# Patient Record
Sex: Female | Born: 1965 | Race: White | Hispanic: No | Smoking: Never smoker
Health system: Southern US, Community
[De-identification: ages and names within clinical notes are randomized; demographics above are authoritative.]

## PROBLEM LIST (undated history)

## (undated) DIAGNOSIS — G20A1 Parkinson's disease without dyskinesia, without mention of fluctuations: Secondary | ICD-10-CM

## (undated) DIAGNOSIS — G259 Extrapyramidal and movement disorder, unspecified: Secondary | ICD-10-CM

## (undated) DIAGNOSIS — F419 Anxiety disorder, unspecified: Secondary | ICD-10-CM

## (undated) DIAGNOSIS — R251 Tremor, unspecified: Secondary | ICD-10-CM

## (undated) DIAGNOSIS — G243 Spasmodic torticollis: Secondary | ICD-10-CM

## (undated) DIAGNOSIS — F32A Depression, unspecified: Secondary | ICD-10-CM

## (undated) DIAGNOSIS — F329 Major depressive disorder, single episode, unspecified: Secondary | ICD-10-CM

## (undated) DIAGNOSIS — G629 Polyneuropathy, unspecified: Secondary | ICD-10-CM

## (undated) DIAGNOSIS — G35D Multiple sclerosis, unspecified: Secondary | ICD-10-CM

## (undated) DIAGNOSIS — F410 Panic disorder [episodic paroxysmal anxiety] without agoraphobia: Secondary | ICD-10-CM

## (undated) DIAGNOSIS — F4310 Post-traumatic stress disorder, unspecified: Secondary | ICD-10-CM

## (undated) DIAGNOSIS — I498 Other specified cardiac arrhythmias: Secondary | ICD-10-CM

## (undated) DIAGNOSIS — G35 Multiple sclerosis: Secondary | ICD-10-CM

## (undated) HISTORY — DX: Depression, unspecified: F32.A

## (undated) HISTORY — DX: Major depressive disorder, single episode, unspecified: F32.9

## (undated) HISTORY — DX: Post-traumatic stress disorder, unspecified: F43.10

## (undated) HISTORY — PX: FOOT FRACTURE SURGERY: SHX645

## (undated) HISTORY — PX: ADENOIDECTOMY: SUR15

## (undated) HISTORY — DX: Other specified cardiac arrhythmias: I49.8

## (undated) HISTORY — DX: Parkinson's disease without dyskinesia, without mention of fluctuations: G20.A1

## (undated) HISTORY — DX: Spasmodic torticollis: G24.3

## (undated) HISTORY — DX: Extrapyramidal and movement disorder, unspecified: G25.9

## (undated) HISTORY — DX: Multiple sclerosis: G35

## (undated) HISTORY — PX: OVARIAN CYST REMOVAL: SHX89

## (undated) HISTORY — PX: ABDOMINAL HYSTERECTOMY: SHX81

## (undated) HISTORY — DX: Tremor, unspecified: R25.1

## (undated) HISTORY — DX: Multiple sclerosis, unspecified: G35.D

## (undated) HISTORY — DX: Polyneuropathy, unspecified: G62.9

---

## 1997-07-05 ENCOUNTER — Encounter: Admission: RE | Admit: 1997-07-05 | Discharge: 1997-10-03 | Payer: Self-pay | Admitting: *Deleted

## 2000-03-17 ENCOUNTER — Other Ambulatory Visit: Admission: RE | Admit: 2000-03-17 | Discharge: 2000-03-17 | Payer: Self-pay | Admitting: Unknown Physician Specialty

## 2000-03-26 ENCOUNTER — Encounter: Payer: Self-pay | Admitting: Family Medicine

## 2000-03-26 ENCOUNTER — Ambulatory Visit (HOSPITAL_COMMUNITY): Admission: RE | Admit: 2000-03-26 | Discharge: 2000-03-26 | Payer: Self-pay | Admitting: Family Medicine

## 2000-04-10 ENCOUNTER — Other Ambulatory Visit: Admission: RE | Admit: 2000-04-10 | Discharge: 2000-04-10 | Payer: Self-pay | Admitting: *Deleted

## 2003-03-05 ENCOUNTER — Emergency Department (HOSPITAL_COMMUNITY): Admission: EM | Admit: 2003-03-05 | Discharge: 2003-03-05 | Payer: Self-pay | Admitting: *Deleted

## 2003-03-15 ENCOUNTER — Other Ambulatory Visit: Admission: RE | Admit: 2003-03-15 | Discharge: 2003-03-15 | Payer: Self-pay | Admitting: Obstetrics and Gynecology

## 2004-09-03 ENCOUNTER — Ambulatory Visit: Payer: Self-pay | Admitting: Cardiology

## 2004-09-06 ENCOUNTER — Ambulatory Visit: Payer: Self-pay | Admitting: Cardiology

## 2004-09-25 ENCOUNTER — Ambulatory Visit: Payer: Self-pay | Admitting: Cardiology

## 2004-09-28 ENCOUNTER — Ambulatory Visit: Payer: Self-pay | Admitting: Gastroenterology

## 2004-10-03 ENCOUNTER — Encounter (INDEPENDENT_AMBULATORY_CARE_PROVIDER_SITE_OTHER): Payer: Self-pay | Admitting: Specialist

## 2004-10-03 ENCOUNTER — Ambulatory Visit: Payer: Self-pay | Admitting: Gastroenterology

## 2005-02-01 ENCOUNTER — Ambulatory Visit: Payer: Self-pay | Admitting: Cardiology

## 2005-05-11 ENCOUNTER — Emergency Department (HOSPITAL_COMMUNITY): Admission: EM | Admit: 2005-05-11 | Discharge: 2005-05-11 | Payer: Self-pay | Admitting: Emergency Medicine

## 2005-05-23 ENCOUNTER — Other Ambulatory Visit: Admission: RE | Admit: 2005-05-23 | Discharge: 2005-05-23 | Payer: Self-pay | Admitting: Obstetrics and Gynecology

## 2005-08-21 ENCOUNTER — Encounter (INDEPENDENT_AMBULATORY_CARE_PROVIDER_SITE_OTHER): Payer: Self-pay | Admitting: Specialist

## 2005-08-21 ENCOUNTER — Ambulatory Visit (HOSPITAL_COMMUNITY): Admission: RE | Admit: 2005-08-21 | Discharge: 2005-08-22 | Payer: Self-pay | Admitting: Obstetrics and Gynecology

## 2005-11-05 ENCOUNTER — Ambulatory Visit: Payer: Self-pay | Admitting: Cardiology

## 2006-02-27 ENCOUNTER — Encounter: Admission: RE | Admit: 2006-02-27 | Discharge: 2006-02-27 | Payer: Self-pay | Admitting: Obstetrics and Gynecology

## 2008-06-06 ENCOUNTER — Encounter: Admission: RE | Admit: 2008-06-06 | Discharge: 2008-06-06 | Payer: Self-pay | Admitting: Family Medicine

## 2010-06-22 NOTE — Op Note (Signed)
[name], [name]             ACCOUNT NO.:  [id number]   MEDICAL RECORD NO.:  [id number]          PATIENT TYPE:  OIB   LOCATION:  9304                          FACILITY:  [facility]   PHYSICIAN:  Huel Cote, M.D. DATE OF BIRTH:  12/30/1965   DATE OF PROCEDURE:  08/21/2005  DATE OF DISCHARGE:                                 OPERATIVE REPORT   PREOPERATIVE DIAGNOSES:  1.  Menorrhagia.  2.  Pain.  3.  Fibroid uterus.   POSTOPERATIVE DIAGNOSES:  1.  Menorrhagia.  2.  Pain.  3.  Fibroid uterus.   PROCEDURE:  Laparoscopic-assisted vaginal hysterectomy and a right groin  excision of a mass by Dr. Kendrick Ranch.   SURGEON:  Dr. Huel Cote and Dr. Kendrick Ranch.   ASSISTANT:  Dr. Tracey Harries.   ANESTHESIA:  General.   SPECIMENS:  Uterus and cervix were sent and in addition probable lymph node  from Dr. Earlene Plater. They were sent to pathology intraoperatively.   ESTIMATED BLOOD LOSS:  200 mL.   URINE OUTPUT:  200 mL clear urine.   IV FLUIDS:  Approximately 2500 mL LR.   FINDINGS:  Uterus was 8 to 10 weeks in size and globular with a large  posterior fibroid noted.  It was retroverted.  Tubes and ovaries were  normal.  Posterior cul-de-sac did have some fullness noted at the time of  surgery.  However, with the rectal exam and a vaginal exam performed  simultaneously with laparoscopic probing, no definitive mass could be  identified.  It was felt it was redundant tissue.   PROCEDURE:  The patient was taken to the operating room where general  anesthesia was obtained without difficulty.  She was then prepped and draped  in the normal sterile fashion in the dorsal lithotomy position.  A speculum  was placed within the vagina, and the uterine manipulator from [employer] was  utilized.  The uterus was sounded to approximately 8 mm, and 8-mm tip was  placed on the manipulator.  This was then introduced into the uterus and the  balloon insufflated with approximately 7 mL of saline.  The  Foley catheter  was then placed in the bladder, and at this point, the attention was turned  to the patient's abdomen where an infraumbilical incision was made through a  preexisting scar with a scalpel after infiltration with 0.25% Marcaine.  The  Veress disposable needle was then placed within the incision and introduced  into the peritoneal cavity with aspiration and injection to confirm  intraperitoneal placement.  The pneumoperitoneum was then obtained with  approximately 2-1/2 liter CO2 gas.  A 5-mm trocar was then placed with the  camera in place under direct visualization and pelvis and abdomen inspected  with findings as previously stated.  Two additional 5-mm trocars were placed  in each lower quadrant after injection with 0.25% Marcaine under direct  visualization.  Attention was then turned to the patient's left where the  utero-ovarian ligaments which were slightly adherent to the patient's  sidewall were taken down sequentially with the [employer] cutting cautery unit.  The utero-ovarian round ligaments and  fallopian tubes were all transected  carefully and good hemostasis noted.  This was taken down to the level of  the bladder flap which was then underscored and also taken down to the  midline of the uterus.  Thus, with uterus freed on the left, attention was  turned to the right where similarly the fallopian tube, utero-ovarian  ligament and remaining broad ligament and round ligament were taken down  sequentially with cutting cautery unit and good hemostasis noted.  Bladder  flap was also taken down to the midline.  This dissection was then complete,  and all instruments and sponges were removed from the abdomen with the  trocars left in place and camera and gas turned off.  Attention was then  turned vaginally where the uterine manipulator was removed and the weighted  speculum placed within in the vagina.  Cervix was grasped with 2 Jacobson  tenaculums and circumferentially  incised with Bovie cautery.  This was then  further dissected with Mayo scissors.  The vaginal mucosa was dissected off  the underlying cervix.  The posterior cul-de-sac was then entered sharply  and Bonano speculum placed within it.  The uterosacral ligaments were then  taken down bilaterally with Zeppelin clamps, transection and suture ligature  of 0 Vicryl.  At this point, attention was turned to the anterior cul-de-sac  which was identified and entered sharply.  The retractor was then placed  within this, and with the uterus isolated from the bladder and the rectum,  it was then dissected further from the paracervical tissues up through the  uterine artery pedicles and broad ligament.  Each step was clamped with  Zeppelin clamps, transected and suture ligated with 0 Vicryl.  This was  carried up to where the previous dissection had occurred laparoscopically.  At this point with minimal tissue remaining, the uterus was grasped with  towel clips at the fundus and delivered from the vagina.  The remaining  tissue was clamped with 2 Heaney clamps, transected and 2 free ties placed  around it.  There was no active bleeding noted, and all pedicles appeared  hemostatic.  At this point, the Bonano speculum was replaced with the  shorter weighted speculum, and the posterior cuff was run in a running  locked fashion for hemostasis.  The uterosacral ligaments were then  reapproximated with a 0 Vicryl suture which was held in a hemostat and the  peritoneum closed with 2-0 Vicryl switched in a pursestring fashion.  The  peritoneum was then tied down.  The uterosacral ligaments tied down and all  sutures cut.  The vaginal cuff was then closed additionally with 2-0 in a  running locked fashion.  No active bleeding was noted at this point, and  therefore all instruments and sponges were removed from the vagina.  Counts  were correct.  Attention was returned to the patient's abdomen where gas  was reapplied and the camera turned on.  The abdomen and pelvis were carefully  inspected.  The vaginal cuff appeared very hemostatic.  There was a slight  bulging noted at the posterior aspect of the cuff which had been felt to be  the uterine manipulator prior to removal of the uterus.  However, this  persisted.  The patient did have a vaginal exam and a rectal exam performed  by Dr. Tracey Harries and had laparoscopic probing at the same time, and no  definitive mass could clearly be identified.  It was felt if anything there  could  be a small parasitic fibroid, but this could not be clearly seen, and  vaginally, the cul-de-sac had been carefully palpated and not felt.  It was  felt that likely this was just redundant vaginal tissue, and therefore no  further action was taken.  Instruments and trocars were then removed under  direct visualization with no active bleeding noted and the pneumoperitoneum  reduced.  The abdominal incisions were then closed with 3-0 Vicryl in a  subcuticular stitch, and the patient was left in the care of Dr. Kendrick Ranch  who is going to complete her node dissection of the groin.      Huel Cote, M.D.  Electronically Signed     KR/MEDQ  D:  08/21/2005  T:  08/21/2005  Job:  [id number]

## 2010-06-22 NOTE — Discharge Summary (Signed)
[name], [name]             ACCOUNT NO.:  [id number]   MEDICAL RECORD NO.:  [id number]          PATIENT TYPE:  OIB   LOCATION:  9304                          FACILITY:  [facility]   PHYSICIAN:  Timothy E. Earlene Plater, M.D. DATE OF BIRTH:  1965-09-06   DATE OF ADMISSION:  08/21/2005  DATE OF DISCHARGE:                                 DISCHARGE SUMMARY   PREOP DIAGNOSIS:  Chronic lymphadenopathy right groin.   PROCEDURE:  Excision of lymph node right groin.   SURGEON:  Dr. Onalee Hua.   ANESTHESIA:  General.   HISTORY:  [name] had been seen and evaluated in the office at  the outset of planning for this gynecologic surgery and we have agreed to  proceed with excision on lymph node at this time.  Dr. Georgianne Fick had  proceeded with induction of anesthesia and a hysterectomy.  Please see her  reports.   I entered the room at the completion of the hysterectomy and the patient was  stable.  Permit was signed.  She had been identified.  The right groin was  examined.  It had been prepped and draped.  A lymph node was palpated.  The  area was marked and _injection________ with epinephrine was used.  A small  incision made over the lymph node.  It was dissected from the deep  subcutaneous space.  Bleeding points were cauterized, the lymph node removed  and submitted in formalin for routine exam.  The wound was closed in layers  with Monocryl and Steri-strips.  She tolerated it well, final count was  correct.  She was awakened and taken to the recovery room in good condition.      Timothy E. Earlene Plater, M.D.  Electronically Signed     TED/MEDQ  D:  08/21/2005  T:  08/21/2005  Job:  [id number]   cc:   Huel Cote, M.D.  Fax: [phone]

## 2010-06-22 NOTE — Assessment & Plan Note (Signed)
[facility]                            [facility] OFFICE NOTE   NAME:[name], [name]                    MRN:          [id number]  DATE:11/05/2005                            DOB:          1965/09/25    PRIMARY CARE PHYSICIAN:  Alfredia Client, M.D.   REASON FOR VISIT:  Follow up palpitations.   HISTORY OF PRESENT ILLNESS:  [name] was in the office last in December  and seen by Arnette Felts at that time.  Her history is outlined in previous  notes and includes recurrent chest pain that is felt to be non-cardiac based  on reassuring cardiac CT angiography and 2D echocardiography.  She does have  documented reflux disease with stricture and is status post esophageal  dilatation by Dr. Russella Dar in the past.  There is a question of mitral valve  prolapse, although this was not noted on her most recent echocardiogram.  She has a reported history of panic attacks and takes Clonazepam.  She has  also had a nice response to Toprol XL from the perspective of palpitations  and anxiety.  She reports that she has had some episodes, although not  frequent, of palpitations and a numb feeling in her left arm.  This is not  precipitated by exertion.  I talked about the overall situation today and  reassured her about her prior cardiac testing.  She has been taking Toprol  XL up to 50 mg a day and I suggested she might consider increasing this to  50 mg twice a day as needed.  We also talked about basic risk modification  strategies.  I suggested that she have a fasting lipid profile obtained by  her primary care physician just for a baseline.   ALLERGIES:  PENICILLIN.   PRESENT MEDICATIONS:  Prevacid 30 mg p.o. b.i.d., Toprol XL 50 mg p.o.  daily, Clonazepam 1 mg p.o. q.i.d.   REVIEW OF SYMPTOMS:  As described in the history of present illness.   PHYSICAL EXAMINATION:  VITAL SIGNS:  Blood pressure 110/62, heart rate 82, weight 127 pounds.  GENERAL:  The  patient is comfortable in no acute distress.  NECK:  No elevated jugular venous pressure.  HEART:  Regular rate and rhythm without rub, murmur, or gallop.  EXTREMITIES:  No obvious pitting edema.   IMPRESSION AND RECOMMENDATIONS:  1. History of palpitations with normal ejection fraction and reassuring CT      angiogram. I suspect that there is an element of anxiety and there has      been no particular arrhythmias documented by prior monitoring.  I have      recommended that she continue her Toprol XL, consider an increase from      50 mg to 100 mg in divided dose if needed.  I would not anticipate any      additional cardiac testing at this particular time except to say that a      fasting lipid profile would be reasonable at baseline to assure that      she does not have dyslipidemia  that would need to be managed, otherwise.  We can plan to see her back      as needed.  2. Otherwise, continue regular follow up with Dr. Morrie Sheldon.       Jonelle Sidle, MD     SGM/MedQ  DD:  11/05/2005  DT:  11/06/2005  Job #:  [id number]   cc:   Alfredia Client, M.D.

## 2010-06-22 NOTE — H&P (Signed)
[name], [name]             ACCOUNT NO.:  [id number]   MEDICAL RECORD NO.:  [id number]          PATIENT TYPE:  AMB   LOCATION:  SDC                           FACILITY:  [facility]   PHYSICIAN:  Huel Cote, M.D. DATE OF BIRTH:  1965/07/27   DATE OF ADMISSION:  08/21/2005  DATE OF DISCHARGE:                                HISTORY & PHYSICAL   This is a preoperative history and physical.  Surgery is to take place at  [facility] on August 21, 2005 at 8:30 a.m.   The patient is a 45 year old G1, P1 who is coming in for a scheduled  laparoscopic-assisted vaginal hysterectomy and possible abdominal  hysterectomy, given an ongoing problem with menorrhagia and flooding.  The  patient has tried alternative therapies in the past, including oral  contraceptive pills; however, does not typically tolerate these secondary to  nausea.  She also approximately one month ago had some issues with  increasing pelvic pain across her abdomen, which radiated into her rectal  area, which has persisted despite her waiting approximately two months for  resolution.  On ultrasound, the patient was confirmed to have multiple  fibroids and also on pelvic exam, her uterus is indeed retroverted and  nodular and sits directly over the rectum.  She requests definitive surgical  therapy for her problems with menorrhagia and pelvic pain related to her  fibroids.  After a careful discussion of all options, wishes to proceed with  surgery.   PAST MEDICAL HISTORY:  Significant for anxiety and palpitations.   PAST GYN HISTORY:  History of HSV.  No abnormal Pap smears.   PAST OB HISTORY:  She has had one vaginal delivery of a 6 pound, 12 ounce  infant.   PAST SURGICAL HISTORY:  In 2002, she had laparoscopy, ovarian cystectomy for  a ruptured ovarian cyst.   CURRENT MEDICATIONS:  Include Toprol, Valtrex, and clonazepam with  occasional Vicodin for pain.   ALLERGIES:  Include PENICILLIN and a DYE that she  received during her  cardiac cath.   During her workup for this pelvic pain, it was discovered that the patient  had a persistent right groin node which had been noted two years ago.  The  patient had been encouraged to get worked up.  The patient had one episode  where this became quite swollen and ruptured with exudate noted.  She was  placed on an antibiotic by her primary medical doctor at that time; however,  her lymphadenopathy has really persisted for several years.  She saw Dr. Kendrick Ranch separately for this issue, and at the same time, he will excise this  pelvic node at the time of surgery.  She has been worked up for STDs, which  were negative, and states she has not been sexually active in greater than a  year.  The patient was counseled as to the risks and benefits of surgery,  including bleeding, infection, possible damage to bowel and bladder.  She  understands these risks but desires to proceed with surgery, as stated.  The  uterus is quite retroverted, and her cervix  is anterior secondary to this  displacement.  The patient knows that this might make a vaginal approach  slightly more difficult and that should not proceed well, that we would  likely need to make an abdominal transverse incision to complete the  surgery.  She also realizes that if there are any complications, including  bowel or bladder injury, that she would need an abdominal procedure to  correct these problems.  We also  discussed possible removal of her right ovary, given that she had been  having some pain on that side and a possible recent ovarian cyst rupture;  however, should they appear normal, we will leave her ovaries in place.  Dr.  Kendrick Ranch will proceed with excision of her groin node after we complete her  hysterectomy surgery.      Huel Cote, M.D.  Electronically Signed     KR/MEDQ  D:  08/20/2005  T:  08/20/2005  Job:  [id number]

## 2010-07-08 ENCOUNTER — Observation Stay (HOSPITAL_COMMUNITY)
Admission: EM | Admit: 2010-07-08 | Discharge: 2010-07-10 | Disposition: A | Discharge status: Home / Self Care | Payer: Self-pay | Attending: Internal Medicine | Admitting: Internal Medicine

## 2010-07-08 ENCOUNTER — Emergency Department (HOSPITAL_COMMUNITY): Payer: Self-pay

## 2010-07-08 DIAGNOSIS — F410 Panic disorder [episodic paroxysmal anxiety] without agoraphobia: Secondary | ICD-10-CM | POA: Insufficient documentation

## 2010-07-08 DIAGNOSIS — F17200 Nicotine dependence, unspecified, uncomplicated: Secondary | ICD-10-CM | POA: Insufficient documentation

## 2010-07-08 DIAGNOSIS — Z79899 Other long term (current) drug therapy: Secondary | ICD-10-CM | POA: Insufficient documentation

## 2010-07-08 DIAGNOSIS — R0789 Other chest pain: Principal | ICD-10-CM | POA: Insufficient documentation

## 2010-07-08 LAB — COMPREHENSIVE METABOLIC PANEL WITH GFR
ALT: 16 U/L (ref 0–35)
AST: 19 U/L (ref 0–37)
Alkaline Phosphatase: 72 U/L (ref 39–117)
CO2: 25 meq/L (ref 19–32)
Calcium: 9.8 mg/dL (ref 8.4–10.5)
GFR calc Af Amer: >60 mL/min (ref >60)
GFR calc non Af Amer: >60 mL/min (ref >60)
Glucose, Bld: 115 mg/dL — ABNORMAL HIGH (ref 70–99)
Potassium: 3.5 meq/L (ref 3.5–5.1)
Sodium: 139 meq/L (ref 135–145)

## 2010-07-08 LAB — COMPREHENSIVE METABOLIC PANEL
Albumin: 4.2 g/dL (ref 3.5–5.2)
BUN: 9 mg/dL (ref 6–23)
Chloride: 103 mEq/L (ref 96–112)
Creatinine, Ser: 0.64 mg/dL (ref 0.4–1.2)
Total Bilirubin: 0.5 mg/dL (ref 0.3–1.2)
Total Protein: 7.4 g/dL (ref 6.0–8.3)

## 2010-07-08 LAB — CBC
HCT: 41.3 % (ref 36.0–46.0)
Hemoglobin: 14.3 g/dL (ref 12.0–15.0)
MCH: 32.6 pg (ref 26.0–34.0)
MCHC: 34.6 g/dL (ref 30.0–36.0)
MCV: 94.3 fL (ref 78.0–100.0)
Platelets: 200 10*3/uL (ref 150–400)
RBC: 4.38 MIL/uL (ref 3.87–5.11)
RDW: 12.1 % (ref 11.5–15.5)
WBC: 9.1 10*3/uL (ref 4.0–10.5)

## 2010-07-08 LAB — CK TOTAL AND CKMB (NOT AT ARMC)
CK, MB: 1.4 ng/mL (ref 0.3–4.0)
Relative Index: INVALID (ref 0.0–2.5)
Relative Index: INVALID (ref 0.0–2.5)
Total CK: 80 U/L (ref 7–177)
Total CK: 69 U/L (ref 7–177)

## 2010-07-08 LAB — TROPONIN I
Troponin I: <0.3 ng/mL (ref <0.30)
Troponin I: <0.3 ng/mL (ref <0.30)

## 2010-07-10 LAB — TSH: TSH: 2.449 u[IU]/mL (ref 0.350–4.500)

## 2010-08-01 NOTE — Discharge Summary (Signed)
  [name], [name]             ACCOUNT NO.:  [id number]  MEDICAL RECORD NO.:  [id number]  LOCATION:  1505                         FACILITY:  [facility]  PHYSICIAN:  Kathlen Mody, MD       DATE OF BIRTH:  [birth year]  DATE OF ADMISSION:  07/08/2010 DATE OF DISCHARGE:  07/10/2010                              DISCHARGE SUMMARY   PRIMARY CARE PHYSICIAN:  Dr. Creta Levin, and a physician at [facility].  DISCHARGE DIAGNOSES: 1. Chest pain, most likely secondary to an anxiety attack. 2. History of panic attacks. 3. Anxiety attack.  DISCHARGE MEDICATIONS: 1. Acetaminophen 650 mg 4 hours as needed. 2. Loratadine 1 tablet daily. 3. Protonix 40 mg daily. 4. Aspirin 81 mg daily. 5. Klonopin 0.5 mg half a tablet to one tablet take 3 times a day as     needed for 5 days.  CONSULTATIONS:  None.  PROCEDURES:  None.  PERTINENT LABORATORY DATA:  On admission, the patient had a CBC which was within normal limits.  CK-MB and troponin which were within normal limits.  The next set of cardiac enzymes x3 were within normal limits. TSH was within normal limits.  Comprehensive metabolic panel was significant for a glucose of 115.  RADIOLOGICAL DATA:  The patient had a 2-view chest x-ray showed no acute cardiopulmonary abnormality.  The patient had an echocardiogram done which showed good ejection fraction, left ventricular ejection fraction, no regional wall abnormalities, the left ventricular diastolic function parameters normal, and right ventricular systolic function was also normal, no pericardial effusion.  BRIEF HOSPITAL COURSE:  This is a 45 year old female with history of panic attacks, was admitted for chest pressure.  She was admitted to rule out acute coronary syndrome.  Her enzymes were negative.  Her EKG was normal sinus rhythm.  Her echocardiogram was within normal limits. Her TSH was normal.  Her tele monitoring was normal and her chest pain was attributed most  likely secondary to her anxiety attack and she resumed her Klonopin and she was recommended to follow up with her PCP in about 2-3 days and she was given appropriate followups with her PCP and also further addressing with her medications. On the day of discharge, the patient's vitals were within normal limits. Cardiac exam was within normal limits.  She was recommended to follow up with her PCP in about 2-3 days.          ______________________________ Kathlen Mody, MD     VA/MEDQ  D:  07/30/2010  T:  07/30/2010  Job:  [id number]  Electronically Signed by Kathlen Mody MD on 08/01/2010 07:35:14 AM

## 2010-08-20 NOTE — H&P (Signed)
NAME:  [name], [name] NO.:  [id number]  MEDICAL RECORD NO.:  [id number]           [name] TYPE:  E  LOCATION:  WLED                         FACILITY:  [facility]  PHYSICIAN:  [name], [name]:  [name]  DATE OF ADMISSION:  07/08/2010 DATE OF DISCHARGE:                             HISTORY & PHYSICAL   PRIMARY CARE PHYSICIAN:  Warrick Parisian, MD, with [facility].  [name] was evaluated by Dr. Diona Browner in 2008 but has never seen him post her hospital visit.  CHIEF COMPLAINT:  Chest pain.  HISTORY OF PRESENT ILLNESS:  [name] is a 45 year old female with no real significant medical history who presents with a complaint of chest pain.  [name] states that she has been having an underlying heaviness in her chest that has been occurring for several months.  She states, however, in Ashley last 2 weeks, she has had intermittent sharp pains in her chest going up in to Ashley jaw and radiating down Ashley left upper extremity.  She states that these pains last approximately 20 to 30 minutes at a time and resolve on their own.  She is unable to identify any palliative or provocative features.  She states that Ashley pain is usually about a 6/10 when it occurs.  She does not identify Ashley pain specifically with activity and states that it occurs both with activity and at rest.  She has had no fever or chills.  She has had no nausea, vomiting or diarrhea.  She has had no weight loss or weight gain.  She states that usually accompanying Ashley episodes are shortness of breath and diaphoresis.  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  PAST MEDICAL HISTORY:  Significant for panic attacks.  FAMILY HISTORY:  Significant for cancer in her mother; [name] does not know what form of cancer, and father who has an arrhythmia.  There is no history of coronary artery disease or myocardial  infarctions in Ashley family.  SOCIAL HISTORY:  [name] smokes about one pack per week for about 4 years.  She denies any alcohol or drug use.  As noted, she works as a Lawyer.  Currently [name] is on XXX status as she does not want her family to know that she is here in Ashley hospital.  HOME MEDICATIONS:  Include Klonopin.  At this time, I do not have frequency and dosage.  ALLERGIES: 1. PENICILLIN. 2. SULFA. 3. IV DYE.  RADIOLOGIC AND LABORATORY DATA:  Review of former records:  [name] has a cardiac CT which was found to be normal.  Studies in Ashley emergency room show Ashley following:  Hemogram shows a white blood cell count of 9.1, hemoglobin of 14.3, hematocrit of 41.3, platelet count of 200.  Sodium 139, potassium 3.5, chloride 103, bicarb 25, BUN 9, creatinine 0.64.  CK is 80 and troponin is less than 0.30. Two-view chest x-ray shows no acute cardiopulmonary abnormality.  PHYSICAL EXAMINATION:  GENERAL:  [name] is a slim, well-nourished, well-developed female. VITAL SIGNS:  Temperature is 98.5, heart  rate 70, blood pressure 111/71, respiratory rate 14, O2 saturations are 99% on room air.  HEENT:  She is normocephalic, atraumatic.  Pupils are equally round, reactive to light and accommodation.  Extraocular movements are intact.  Oropharynx is moist.  No exudates, erythema or lesions are noted. NECK:  Trachea is midline.  No masses, no thyromegaly, no JVD, no carotid bruit. RESPIRATORY:  Normal respiratory effort, equal excursion bilaterally. No wheezing or rhonchi noted. CARDIOVASCULAR:  Normal S1 and S2.  No murmurs, rubs or gallops noted. PMI is nondisplaced.  No heaves or thrills on palpation. ABDOMEN:  Obese, soft, nontender, nondistended.  No masses, no hepatosplenomegaly is noted. EXTREMITIES:  No clubbing, cyanosis or edema. PSYCHIATRIC:  Alert and oriented x3, good insight and cognition, good recent and remote recall.  ASSESSMENT AND PLAN:  This is a  [name] who is being admitted on an observation basis for chest pain, rule out myocardial infarction.  [name] is low risk for a myocardial infarction.  We will cycle [name]'s enzymes, and if they are normal, and [name] is ambulating without any episodes of pain, I think this [name] can safely be further evaluated on outpatient basis.  I will not make [name] n.p.o. as I am not anticipating a stress test on this [name].  We will do risk stratification and check her lipids to see where she falls under Ashley risk category.  I will also check a TSH on her.  [name] will be resumed on Klonopin.  I do believe that this is likely more related to anxiety, given Ashley negative enzymes when [name] has been having chest pain lasting more than 30 minutes at a time.  Thus far, there are no other acute issues identified on this [name]. She will receive deep venous thrombosis prophylaxis with Lovenox and further testing will be done on [name]'s clinical course and initial response to therapy.     Ashley Harm, MD     MAM/MEDQ  D:  07/08/2010  T:  07/08/2010  Job:  [id number]  cc:   Warrick Parisian, MD Fax: [phone]  Electronically Signed by Marthann Schiller MD on 08/20/2010 05:55:09 PM

## 2011-03-06 ENCOUNTER — Emergency Department (HOSPITAL_COMMUNITY): Payer: Self-pay

## 2011-03-06 ENCOUNTER — Encounter (HOSPITAL_COMMUNITY): Payer: Self-pay | Admitting: *Deleted

## 2011-03-06 ENCOUNTER — Emergency Department (HOSPITAL_COMMUNITY)
Admission: EM | Admit: 2011-03-06 | Discharge: 2011-03-06 | Disposition: A | Discharge status: Home / Self Care | Payer: Self-pay | Attending: Emergency Medicine | Admitting: Emergency Medicine

## 2011-03-06 ENCOUNTER — Other Ambulatory Visit: Payer: Self-pay

## 2011-03-06 DIAGNOSIS — R0602 Shortness of breath: Secondary | ICD-10-CM | POA: Insufficient documentation

## 2011-03-06 DIAGNOSIS — R42 Dizziness and giddiness: Secondary | ICD-10-CM | POA: Insufficient documentation

## 2011-03-06 DIAGNOSIS — R0609 Other forms of dyspnea: Secondary | ICD-10-CM | POA: Insufficient documentation

## 2011-03-06 DIAGNOSIS — R110 Nausea: Secondary | ICD-10-CM | POA: Insufficient documentation

## 2011-03-06 DIAGNOSIS — R0989 Other specified symptoms and signs involving the circulatory and respiratory systems: Secondary | ICD-10-CM | POA: Insufficient documentation

## 2011-03-06 DIAGNOSIS — R51 Headache: Secondary | ICD-10-CM | POA: Insufficient documentation

## 2011-03-06 DIAGNOSIS — F411 Generalized anxiety disorder: Secondary | ICD-10-CM | POA: Insufficient documentation

## 2011-03-06 DIAGNOSIS — R61 Generalized hyperhidrosis: Secondary | ICD-10-CM | POA: Insufficient documentation

## 2011-03-06 DIAGNOSIS — R071 Chest pain on breathing: Secondary | ICD-10-CM | POA: Insufficient documentation

## 2011-03-06 DIAGNOSIS — E876 Hypokalemia: Secondary | ICD-10-CM | POA: Insufficient documentation

## 2011-03-06 DIAGNOSIS — R0789 Other chest pain: Secondary | ICD-10-CM

## 2011-03-06 DIAGNOSIS — I10 Essential (primary) hypertension: Secondary | ICD-10-CM | POA: Insufficient documentation

## 2011-03-06 HISTORY — DX: Anxiety disorder, unspecified: F41.9

## 2011-03-06 LAB — BASIC METABOLIC PANEL
BUN: 8 mg/dL (ref 6–23)
CO2: 25 mEq/L (ref 19–32)
Chloride: 105 mEq/L (ref 96–112)
Creatinine, Ser: 0.71 mg/dL (ref 0.50–1.10)
GFR calc Af Amer: >90 mL/min (ref >90)
Potassium: 3.2 mEq/L — ABNORMAL LOW (ref 3.5–5.1)

## 2011-03-06 LAB — DIFFERENTIAL
Basophils Absolute: 0 10*3/uL (ref 0.0–0.1)
Basophils Relative: 0 % (ref 0–1)
Lymphocytes Relative: 17 % (ref 12–46)
Monocytes Absolute: 0.5 10*3/uL (ref 0.1–1.0)
Monocytes Relative: 5 % (ref 3–12)
Neutro Abs: 7.3 10*3/uL (ref 1.7–7.7)
Neutrophils Relative %: 77 % (ref 43–77)

## 2011-03-06 LAB — CBC
HCT: 40.2 % (ref 36.0–46.0)
Hemoglobin: 13.8 g/dL (ref 12.0–15.0)
WBC: 9.4 10*3/uL (ref 4.0–10.5)

## 2011-03-06 MED ORDER — KETOROLAC TROMETHAMINE 30 MG/ML IJ SOLN
30.0000 mg | Freq: Once | INTRAMUSCULAR | Status: AC
  Administered 2011-03-06: 30 mg via INTRAVENOUS
  Filled 2011-03-06: qty 1

## 2011-03-06 MED ORDER — ASPIRIN 81 MG PO CHEW
324.0000 mg | CHEWABLE_TABLET | Freq: Once | ORAL | Status: AC
  Administered 2011-03-06: 324 mg via ORAL
  Filled 2011-03-06: qty 4

## 2011-03-06 MED ORDER — POTASSIUM CHLORIDE CRYS ER 20 MEQ PO TBCR: 20.0000 meq | EXTENDED_RELEASE_TABLET | Freq: Two times a day (BID) | ORAL | Status: DC

## 2011-03-06 MED ORDER — METOCLOPRAMIDE HCL 10 MG PO TABS: 10.0000 mg | ORAL_TABLET | Freq: Four times a day (QID) | ORAL | Status: AC | PRN

## 2011-03-06 MED ORDER — POTASSIUM CHLORIDE CRYS ER 20 MEQ PO TBCR
40.0000 meq | EXTENDED_RELEASE_TABLET | Freq: Once | ORAL | Status: AC
  Administered 2011-03-06: 40 meq via ORAL
  Filled 2011-03-06: qty 2

## 2011-03-06 MED ORDER — DIPHENHYDRAMINE HCL 50 MG/ML IJ SOLN
25.0000 mg | Freq: Once | INTRAMUSCULAR | Status: AC
  Administered 2011-03-06: 25 mg via INTRAVENOUS
  Filled 2011-03-06: qty 1

## 2011-03-06 MED ORDER — METOCLOPRAMIDE HCL 5 MG/ML IJ SOLN
10.0000 mg | Freq: Once | INTRAMUSCULAR | Status: AC
  Administered 2011-03-06: 10 mg via INTRAVENOUS
  Filled 2011-03-06: qty 2

## 2011-03-06 NOTE — ED Provider Notes (Signed)
History     CSN: [id number]  Arrival date & time 03/06/11  1428   First MD Initiated Contact with Patient 03/06/11 1533      Chief Complaint  Patient presents with  . Chest Pain  . Headache    (Consider location/radiation/quality/duration/timing/severity/associated sxs/prior treatment) Patient is a 46 y.o. female presenting with chest pain and headaches. The history is provided by the patient.  Chest Pain    Headache   She started having chest pain last night. Pain is a sharp, stabbing pain in the left anterior chest which is momentary and is followed by a steady tight feeling. She rates it at 6/10 currently but has been as severe as 8/10. There is intermittent associated dyspnea and intermittent associated nausea. She will get mild diaphoresis but this only occurs during the times when she has the sharp, stabbing pains. Pain is not affected by body position, breathing, movement, exertion. As far she's been out of tell, nothing makes it better nothing makes it worse. She did take 2 doses of aspirin 81 mg with no improvement and she took an extra dose of her Klonopin with no improvement. She was hospitalized for a similar pain before and to nothing serious was found. She also developed a headache last night which is a throbbing frontal headache which she rates at 6/10. Nothing makes the headache better nothing makes it worse. This is unusual for her because she states that she usually does not get headaches. While driving over to the hospital, she started feeling anxious and felt like she might pass out. Her last several days, she has also been checking her blood pressure at home and has found it to be as low as 70/40. She states that the monitor that she use has been checked against other monitors and has been accurate. She has felt dizzy and lightheaded at home with these episodes and has been trying to eat a lot of salt but has been unable to get her diastolic pressure over 40. Her cardiac  risk factors are only for a positive family history of coronary artery disease with her father having heart problems her started in his 58s. She is a nonsmoker and denies history of hypertension, diabetes, and hypercholesterolemia.  Past Medical History  Diagnosis Date  . Anxiety     Past Surgical History  Procedure Date  . Abdominal hysterectomy   . Adenoidectomy     No family history on file.  History  Substance Use Topics  . Smoking status: Former Games developer  . Smokeless tobacco: Not on file  . Alcohol Use: No    OB History    Grav Para Term Preterm Abortions TAB SAB Ect Mult Living                  Review of Systems  Cardiovascular: Positive for chest pain.  Neurological: Positive for headaches.  All other systems reviewed and are negative.    Allergies  Penicillins and Sulfa antibiotics  Home Medications   Current Outpatient Rx  Name Route Sig Dispense Refill  . CLONAZEPAM 0.5 MG PO TABS Oral Take 0.5 mg by mouth 2 (two) times daily as needed.      BP 162/89  Pulse 100  Resp 18  Wt 140 lb (63.504 kg)  SpO2 100%  Physical Exam  Nursing note and vitals reviewed.  46 year old female who is resting comfortably and in no acute distress. Vital signs are significant for mild hypertension with blood pressure 162/89. Oxygen saturation is  100% which is normal. Head is normocephalic and atraumatic. PERRLA, EOMI. Fundi show no hemorrhage, exudate, or papilledema. She has no photophobia. Oropharynx is clear. Neck is supple without adenopathy, JVD, or bruit. Back is nontender. Lungs are clear without rales, wheezes, or rhonchi. Heart has regular rate and rhythm without murmur. There is moderate left-sided chest wall tenderness. Abdomen is soft, flat, nontender without masses or hepatomegaly. Jimmy's have no cyanosis or edema, full range of motion is present. Skin is warm and moist without rash. Neurologic: Mental status is normal except for moderate anxiety. Cranial nerves are  intact, there no focal motor or sensory deficits.  ED Course  Procedures (including critical care time)  Results for orders placed during the hospital encounter of 03/06/11  CBC      Component Value Range   WBC 9.4  4.0 - 10.5 (K/uL)   RBC 4.29  3.87 - 5.11 (MIL/uL)   Hemoglobin 13.8  12.0 - 15.0 (g/dL)   HCT 46.9  62.9 - 52.8 (%)   MCV 93.7  78.0 - 100.0 (fL)   MCH 32.2  26.0 - 34.0 (pg)   MCHC 34.3  30.0 - 36.0 (g/dL)   RDW 41.3  24.4 - 01.0 (%)   Platelets 212  150 - 400 (K/uL)  DIFFERENTIAL      Component Value Range   Neutrophils Relative 77  43 - 77 (%)   Neutro Abs 7.3  1.7 - 7.7 (K/uL)   Lymphocytes Relative 17  12 - 46 (%)   Lymphs Abs 1.6  0.7 - 4.0 (K/uL)   Monocytes Relative 5  3 - 12 (%)   Monocytes Absolute 0.5  0.1 - 1.0 (K/uL)   Eosinophils Relative 1  0 - 5 (%)   Eosinophils Absolute 0.1  0.0 - 0.7 (K/uL)   Basophils Relative 0  0 - 1 (%)   Basophils Absolute 0.0  0.0 - 0.1 (K/uL)  BASIC METABOLIC PANEL      Component Value Range   Sodium 142  135 - 145 (mEq/L)   Potassium 3.2 (*) 3.5 - 5.1 (mEq/L)   Chloride 105  96 - 112 (mEq/L)   CO2 25  19 - 32 (mEq/L)   Glucose, Bld 105 (*) 70 - 99 (mg/dL)   BUN 8  6 - 23 (mg/dL)   Creatinine, Ser 2.72  0.50 - 1.10 (mg/dL)   Calcium 9.6  8.4 - 53.6 (mg/dL)   GFR calc non Af Amer >90  >90 (mL/min)   GFR calc Af Amer >90  >90 (mL/min)   Dg Chest 2 View  03/06/2011  *RADIOLOGY REPORT*  Clinical Data: Chest pain and shortness of breath.  CHEST - 2 VIEW  Comparison: 07/08/2010  Findings: Heart size and vascularity are normal and the lungs are clear.  No osseous abnormality.  IMPRESSION: Normal chest.  Original Report Authenticated By: Gwynn Burly, M.D.    She was given an IV fluid bolus and IV Toradol, IV Reglan, and IV Benadryl. Following this, she feels much better. Blood pressure is consistently stayed high normal to high. There is no evidence of any hypotension. Orthostatic vital signs are normal. She does have  mild hypokalemia and will be given a potassium supplement.  1. Chest wall pain   2. Headache   3. Hypokalemia       MDM  Old records are reviewed and she was hospitalized last June for chest pain which was felt to be due to anxiety. Current pain has a low likelihood  of being cardiac with it being constant and the sharp stabbing pains. Her allegedly low blood pressure at home is difficult to reconcile with high blood pressure readings here and I suspect that her monitor at home is giving her faulty readings. However, she will have with a static vital signs checked here. She'll get a headache cocktail with Toradol, Reglan, and Benadryl and to see its effect on both the chest pain and her headache.        Dione Booze, MD 03/07/11 1538

## 2011-03-06 NOTE — ED Notes (Signed)
Pt states "I've been feeling weak for several days, have been checking blood pressure @ home & it's been being low"

## 2011-03-06 NOTE — Discharge Instructions (Signed)
Chest Pain (Nonspecific) It is often hard to give a specific diagnosis for the cause of chest pain. There is always a chance that your pain could be related to something serious, such as a heart attack or a blood clot in the lungs. You need to follow up with your caregiver for further evaluation. CAUSES   Heartburn.   Pneumonia or bronchitis.   Anxiety and stress.   Inflammation around your heart (pericarditis) or lung (pleuritis or pleurisy).   A blood clot in the lung.   A collapsed lung (pneumothorax). It can develop suddenly on its own (spontaneous pneumothorax) or from injury (trauma) to the chest.  The chest wall is composed of bones, muscles, and cartilage. Any of these can be the source of the pain.  The bones can be bruised by injury.   The muscles or cartilage can be strained by coughing or overwork.   The cartilage can be affected by inflammation and become sore (costochondritis).  DIAGNOSIS  Lab tests or other studies, such as X-rays, an EKG, stress testing, or cardiac imaging, may be needed to find the cause of your pain.  TREATMENT   Treatment depends on what may be causing your chest pain. Treatment may include:   Acid blockers for heartburn.   Anti-inflammatory medicine.   Pain medicine for inflammatory conditions.   Antibiotics if an infection is present.   You may be advised to change lifestyle habits. This includes stopping smoking and avoiding caffeine and chocolate.   You may be advised to keep your head raised (elevated) when sleeping. This reduces the chance of acid going backward from your stomach into your esophagus.   Most of the time, nonspecific chest pain will improve within 2 to 3 days with rest and mild pain medicine.  HOME CARE INSTRUCTIONS   If antibiotics were prescribed, take the full amount even if you start to feel better.   For the next few days, avoid physical activities that bring on chest pain. Continue physical activities as  directed.   Do not smoke cigarettes or drink alcohol until your symptoms are gone.   Only take over-the-counter or prescription medicine for pain, discomfort, or fever as directed by your caregiver.   Follow your caregiver's suggestions for further testing if your chest pain does not go away.   Keep any follow-up appointments you made. If you do not go to an appointment, you could develop lasting (chronic) problems with pain. If there is any problem keeping an appointment, you must call to reschedule.  SEEK MEDICAL CARE IF:   You think you are having problems from the medicine you are taking. Read your medicine instructions carefully.   Your chest pain does not go away, even after treatment.   You develop a rash with blisters on your chest.  SEEK IMMEDIATE MEDICAL CARE IF:   You have increased chest pain or pain that spreads to your arm, neck, jaw, back, or belly (abdomen).   You develop shortness of breath, an increasing cough, or you are coughing up blood.   You have severe back or abdominal pain, feel sick to your stomach (nauseous) or throw up (vomit).   You develop severe weakness, fainting, or chills.   You have an oral temperature above 102 F (38.9 C), not controlled by medicine.  THIS IS AN EMERGENCY. Do not wait to see if the pain will go away. Get medical help at once. Call your local emergency services (911 in [place] Do not drive yourself to   the hospital. MAKE SURE YOU:   Understand these instructions.   Will watch your condition.   Will get help right away if you are not doing well or get worse.  Document Released: 10/31/2004 Document Revised: 10/03/2010 Document Reviewed: 08/27/2007 [facility] Patient Information 2012 [place], [place].  Headache Headaches are caused by many different problems. Most commonly, headache is caused by muscle tension from an injury, fatigue, or emotional upset. Excessive muscle contractions in the scalp and neck result in a headache that  often feels like a tight band around the head. Tension headaches often have areas of tenderness over the scalp and the back of the neck. These headaches may last for hours, days, or longer, and some may contribute to migraines in those who have migraine problems. Migraines usually cause a throbbing headache, which is made worse by activity. Sometimes only one side of the head hurts. Nausea, vomiting, eye pain, and avoidance of food are common with migraines. Visual symptoms such as light sensitivity, blind spots, or flashing lights may also occur. Loud noises may worsen migraine headaches. Many factors may cause migraine headaches:  Emotional stress, lack of sleep, and menstrual periods.   Alcohol and some drugs (such as birth control pills).   Diet factors (fasting, caffeine, food preservatives, chocolate).   Environmental factors (weather changes, bright lights, odors, smoke).  Other causes of headaches include minor injuries to the head. Arthritis in the neck; problems with the jaw, eyes, ears, or nose are also causes of headaches. Allergies, drugs, alcohol, and exposure to smoke can also cause moderate headaches. Rebound headaches can occur if someone uses pain medications for a long period of time and then stops. Less commonly, blood vessel problems in the neck and brain (including stroke) can cause various types of headache. Treatment of headaches includes medicines for pain and relaxation. Ice packs or heat applied to the back of the head and neck help some people. Massaging the shoulders, neck and scalp are often very useful. Relaxation techniques and stretching can help prevent these headaches. Avoid alcohol and cigarette smoking as these tend to make headaches worse. Please see your caregiver if your headache is not better in 2 days.  SEEK IMMEDIATE MEDICAL CARE IF:   You develop a high fever, chills, or repeated vomiting.   You faint or have difficulty with vision.   You develop unusual  numbness or weakness of your arms or legs.   Relief of pain is inadequate with medication, or you develop severe pain.   You develop confusion, or neck stiffness.   You have a worsening of a headache or do not obtain relief.  Document Released: 01/21/2005 Document Revised: 10/03/2010 Document Reviewed: 07/17/2006 [employer] Patient Information 2012 [employer], LLCMetoclopramide tablets What is this medicine? METOCLOPRAMIDE (met oh kloe PRA mide) is used to treat the symptoms of gastroesophageal reflux disease (GERD) like heartburn. It is also used to treat people with slow emptying of the stomach and intestinal tract. This medicine may be used for other purposes; ask your health care provider or pharmacist if you have questions. What should I tell my health care provider before I take this medicine? They need to know if you have any of these conditions: -breast cancer -depression -diabetes -heart failure -high blood pressure -kidney disease -liver disease -Parkinson's disease or a movement disorder -pheochromocytoma -seizures -stomach obstruction, bleeding, or perforation -an unusual or allergic reaction to metoclopramide, procainamide, sulfites, other medicines, foods, dyes, or preservatives -pregnant or trying to get pregnant -breast-feeding How should I  use this medicine? Take this medicine by mouth with a glass of water. Follow the directions on the prescription label. Take this medicine on an empty stomach, about 30 minutes before eating. Take your doses at regular intervals. Do not take your medicine more often than directed. Do not stop taking except on the advice of your doctor or health care professional. A special MedGuide will be given to you by the pharmacist with each prescription and refill. Be sure to read this information carefully each time. Talk to your pediatrician regarding the use of this medicine in children. Special care may be needed. Overdosage: If you think you  have taken too much of this medicine contact a poison control center or emergency room at once. NOTE: This medicine is only for you. Do not share this medicine with others. What if I miss a dose? If you miss a dose, take it as soon as you can. If it is almost time for your next dose, take only that dose. Do not take double or extra doses. What may interact with this medicine? -acetaminophen -cyclosporine -digoxin -medicines for blood pressure -medicines for diabetes, including insulin -medicines for hay fever and other allergies -medicines for depression, especially an Monoamine Oxidase Inhibitor (MAOI) -medicines for Parkinson's disease, like levodopa -medicines for sleep or for pain -tetracycline This list may not describe all possible interactions. Give your health care provider a list of all the medicines, herbs, non-prescription drugs, or dietary supplements you use. Also tell them if you smoke, drink alcohol, or use illegal drugs. Some items may interact with your medicine. What should I watch for while using this medicine? It may take a few weeks for your stomach condition to start to get better. However, do not take this medicine for longer than 12 weeks. The longer you take this medicine, and the more you take it, the greater your chances are of developing serious side effects. If you are an elderly patient, a female patient, or you have diabetes, you may be at an increased risk for side effects from this medicine. Contact your doctor immediately if you start having movements you cannot control such as lip smacking, rapid movements of the tongue, involuntary or uncontrollable movements of the eyes, head, arms and legs, or muscle twitches and spasms. Patients and their families should watch out for worsening depression or thoughts of suicide. Also watch out for any sudden or severe changes in feelings such as feeling anxious, agitated, panicky, irritable, hostile, aggressive, impulsive,  severely restless, overly excited and hyperactive, or not being able to sleep. If this happens, especially at the beginning of treatment or after a change in dose, call your doctor. Do not treat yourself for high fever. Ask your doctor or health care professional for advice. You may get drowsy or dizzy. Do not drive, use machinery, or do anything that needs mental alertness until you know how this drug affects you. Do not stand or sit up quickly, especially if you are an older patient. This reduces the risk of dizzy or fainting spells. Alcohol can make you more drowsy and dizzy. Avoid alcoholic drinks. What side effects may I notice from receiving this medicine? Side effects that you should report to your doctor or health care professional as soon as possible: -allergic reactions like skin rash, itching or hives, swelling of the face, lips, or tongue -abnormal production of milk in females -breast enlargement in both males and females -change in the way you walk -difficulty moving, speaking or swallowing -  drooling, lip smacking, or rapid movements of the tongue -excessive sweating -fever -involuntary or uncontrollable movements of the eyes, head, arms and legs -irregular heartbeat or palpitations -muscle twitches and spasms -unusually weak or tired Side effects that usually do not require medical attention (report to your doctor or health care professional if they continue or are bothersome): -change in sex drive or performance -depressed mood -diarrhea -difficulty sleeping -headache -menstrual changes -restless or nervous This list may not describe all possible side effects. Call your doctor for medical advice about side effects. You may report side effects to FDA at 1-800-FDA-1088. Where should I keep my medicine? Keep out of the reach of children. Store at room temperature between 20 and 25 degrees C (68 and 77 degrees F). Protect from light. Keep container tightly closed. Throw away  any unused medicine after the expiration date. NOTE: This sheet is a summary. It may not cover all possible information. If you have questions about this medicine, talk to your doctor, pharmacist, or health care provider.  2012, [employer]. (09/16/2007 4:30:05 PM).

## 2011-03-06 NOTE — ED Notes (Signed)
Urine collected and sent down to lab for keeping. No order for testing at this time. RN notified 

## 2011-08-06 ENCOUNTER — Other Ambulatory Visit (HOSPITAL_COMMUNITY): Payer: Self-pay | Admitting: Nurse Practitioner

## 2011-08-06 DIAGNOSIS — IMO0002 Reserved for concepts with insufficient information to code with codable children: Secondary | ICD-10-CM

## 2011-08-21 ENCOUNTER — Ambulatory Visit (HOSPITAL_COMMUNITY)
Admission: RE | Admit: 2011-08-21 | Discharge: 2011-08-21 | Disposition: A | Discharge status: Home / Self Care | Payer: PRIVATE HEALTH INSURANCE | Source: Ambulatory Visit | Attending: Nurse Practitioner | Admitting: Nurse Practitioner

## 2011-08-21 ENCOUNTER — Other Ambulatory Visit (HOSPITAL_COMMUNITY): Payer: Self-pay | Admitting: Nurse Practitioner

## 2011-08-21 DIAGNOSIS — IMO0002 Reserved for concepts with insufficient information to code with codable children: Secondary | ICD-10-CM

## 2011-08-21 DIAGNOSIS — N63 Unspecified lump in unspecified breast: Secondary | ICD-10-CM | POA: Insufficient documentation

## 2012-10-02 ENCOUNTER — Other Ambulatory Visit: Payer: Self-pay | Admitting: *Deleted

## 2012-10-02 NOTE — Telephone Encounter (Signed)
Wrong chart

## 2014-04-26 ENCOUNTER — Other Ambulatory Visit (HOSPITAL_COMMUNITY): Payer: Self-pay | Admitting: *Deleted

## 2014-04-26 DIAGNOSIS — Z1231 Encounter for screening mammogram for malignant neoplasm of breast: Secondary | ICD-10-CM

## 2014-04-28 ENCOUNTER — Ambulatory Visit (HOSPITAL_COMMUNITY)
Admission: RE | Admit: 2014-04-28 | Discharge: 2014-04-28 | Disposition: A | Discharge status: Home / Self Care | Payer: PRIVATE HEALTH INSURANCE | Source: Ambulatory Visit | Attending: *Deleted | Admitting: *Deleted

## 2014-04-28 DIAGNOSIS — Z1231 Encounter for screening mammogram for malignant neoplasm of breast: Secondary | ICD-10-CM | POA: Insufficient documentation

## 2015-02-09 ENCOUNTER — Emergency Department (HOSPITAL_COMMUNITY)
Admission: EM | Admit: 2015-02-09 | Discharge: 2015-02-09 | Disposition: A | Discharge status: Home / Self Care | Payer: PRIVATE HEALTH INSURANCE | Attending: Emergency Medicine | Admitting: Emergency Medicine

## 2015-02-09 ENCOUNTER — Encounter (HOSPITAL_COMMUNITY): Payer: Self-pay | Admitting: *Deleted

## 2015-02-09 ENCOUNTER — Emergency Department (HOSPITAL_COMMUNITY): Payer: Self-pay

## 2015-02-09 ENCOUNTER — Emergency Department (HOSPITAL_COMMUNITY): Payer: PRIVATE HEALTH INSURANCE

## 2015-02-09 DIAGNOSIS — Z880 Allergy status to penicillin: Secondary | ICD-10-CM | POA: Insufficient documentation

## 2015-02-09 DIAGNOSIS — N644 Mastodynia: Secondary | ICD-10-CM | POA: Insufficient documentation

## 2015-02-09 DIAGNOSIS — R29898 Other symptoms and signs involving the musculoskeletal system: Secondary | ICD-10-CM

## 2015-02-09 DIAGNOSIS — M25522 Pain in left elbow: Secondary | ICD-10-CM | POA: Insufficient documentation

## 2015-02-09 DIAGNOSIS — F410 Panic disorder [episodic paroxysmal anxiety] without agoraphobia: Secondary | ICD-10-CM | POA: Insufficient documentation

## 2015-02-09 DIAGNOSIS — Z7982 Long term (current) use of aspirin: Secondary | ICD-10-CM | POA: Insufficient documentation

## 2015-02-09 DIAGNOSIS — R0789 Other chest pain: Secondary | ICD-10-CM | POA: Insufficient documentation

## 2015-02-09 DIAGNOSIS — M79602 Pain in left arm: Secondary | ICD-10-CM | POA: Insufficient documentation

## 2015-02-09 DIAGNOSIS — Z79899 Other long term (current) drug therapy: Secondary | ICD-10-CM | POA: Insufficient documentation

## 2015-02-09 DIAGNOSIS — G20 Parkinson's disease: Secondary | ICD-10-CM | POA: Insufficient documentation

## 2015-02-09 DIAGNOSIS — G249 Dystonia, unspecified: Secondary | ICD-10-CM | POA: Insufficient documentation

## 2015-02-09 DIAGNOSIS — M25512 Pain in left shoulder: Secondary | ICD-10-CM | POA: Insufficient documentation

## 2015-02-09 DIAGNOSIS — Z87891 Personal history of nicotine dependence: Secondary | ICD-10-CM | POA: Insufficient documentation

## 2015-02-09 HISTORY — DX: Panic disorder (episodic paroxysmal anxiety): F41.0

## 2015-02-09 LAB — BASIC METABOLIC PANEL
ANION GAP: 9 (ref 5–15)
BUN: 7 mg/dL (ref 6–20)
CHLORIDE: 109 mmol/L (ref 101–111)
CO2: 23 mmol/L (ref 22–32)
Calcium: 9.1 mg/dL (ref 8.9–10.3)
Creatinine, Ser: 0.73 mg/dL (ref 0.44–1.00)
GFR calc Af Amer: >60 mL/min (ref >60)
Glucose, Bld: 98 mg/dL (ref 65–99)
POTASSIUM: 3.9 mmol/L (ref 3.5–5.1)
SODIUM: 141 mmol/L (ref 135–145)

## 2015-02-09 LAB — CBC
HCT: 42.4 % (ref 36.0–46.0)
Hemoglobin: 14.5 g/dL (ref 12.0–15.0)
MCH: 32.9 pg (ref 26.0–34.0)
MCHC: 34.2 g/dL (ref 30.0–36.0)
MCV: 96.1 fL (ref 78.0–100.0)
PLATELETS: 201 10*3/uL (ref 150–400)
RBC: 4.41 MIL/uL (ref 3.87–5.11)
RDW: 12.4 % (ref 11.5–15.5)
WBC: 10.8 10*3/uL — ABNORMAL HIGH (ref 4.0–10.5)

## 2015-02-09 LAB — ETHANOL

## 2015-02-09 LAB — RAPID URINE DRUG SCREEN, HOSP PERFORMED
Amphetamines: NOT DETECTED
BARBITURATES: NOT DETECTED
Benzodiazepines: NOT DETECTED
COCAINE: NOT DETECTED
Opiates: NOT DETECTED
Tetrahydrocannabinol: POSITIVE — AB

## 2015-02-09 LAB — I-STAT TROPONIN, ED: TROPONIN I, POC: 0 ng/mL (ref 0.00–0.08)

## 2015-02-09 MED ORDER — GADOBENATE DIMEGLUMINE 529 MG/ML IV SOLN
13.0000 mL | Freq: Once | INTRAVENOUS | Status: AC | PRN
  Administered 2015-02-09: 13 mL via INTRAVENOUS

## 2015-02-09 MED ORDER — LORAZEPAM 2 MG/ML IJ SOLN
2.0000 mg | Freq: Once | INTRAMUSCULAR | Status: AC
  Administered 2015-02-09: 2 mg via INTRAVENOUS
  Filled 2015-02-09: qty 1

## 2015-02-09 MED ORDER — BACLOFEN 10 MG PO TABS: 10.0000 mg | ORAL_TABLET | Freq: Every evening | ORAL | Status: DC | PRN

## 2015-02-09 MED ORDER — LORAZEPAM 2 MG/ML IJ SOLN
1.0000 mg | Freq: Once | INTRAMUSCULAR | Status: AC
Start: 2015-02-09 — End: 2015-02-09
  Administered 2015-02-09: 1 mg via INTRAVENOUS
  Filled 2015-02-09: qty 1

## 2015-02-09 MED ORDER — LORAZEPAM 2 MG/ML IJ SOLN: 1.0000 mg | Freq: Once | INTRAMUSCULAR | Status: DC | PRN

## 2015-02-09 MED ORDER — MORPHINE SULFATE (PF) 4 MG/ML IV SOLN
4.0000 mg | Freq: Once | INTRAVENOUS | Status: AC
  Administered 2015-02-09: 4 mg via INTRAVENOUS
  Filled 2015-02-09: qty 1

## 2015-02-09 NOTE — ED Provider Notes (Signed)
CSN: [id number]     Arrival date & time 02/09/15  1327 History   First MD Initiated Contact with Patient 02/09/15 1329     Chief Complaint  Patient presents with  . Chest Pain  . Numbness    Left arm     (Consider location/radiation/quality/duration/timing/severity/associated sxs/prior Treatment) HPI  50 year old female presents with a chief complaint of left arm pain. She has had numbness and tingling as well as a weakness sensation on and off for about one month. Seems to be worse over the last few days. She states that 4 days ago she developed chest tightness that has come and gone and associated with this has had the left arm weakness and numbness. She went to her doctor today and they told her that her arm was cold and so she was centrically to the ER. Patient states she has not noticed her arm being cold and currently feels like her arm is normal in temperature. She feels like her hand is particular hard to move. The last 2 weeks she's been having a lot of left breast pain and is concerned about breast cancer. This is different than the chest tightness she was experiencing. The chest tightness is currently gone. There is no associated shortness of breath. She denies any lower extremity weakness or numbness. No abdominal pain or vomiting. She is having aching and pain in her left elbow and left shoulder. She has a history of tremors and has been told she has early parkinson's.  Past Medical History  Diagnosis Date  . Anxiety   . Panic attacks    Past Surgical History  Procedure Laterality Date  . Abdominal hysterectomy    . Adenoidectomy     No family history on file. Social History  Substance Use Topics  . Smoking status: Former Games developer  . Smokeless tobacco: Never Used  . Alcohol Use: No   OB History    No data available     Review of Systems  Constitutional: Negative for fever.  Respiratory: Positive for chest tightness. Negative for shortness of breath.   Cardiovascular:  Positive for chest pain.  Gastrointestinal: Negative for vomiting.  Musculoskeletal: Positive for arthralgias. Negative for neck pain.  Neurological: Positive for weakness and numbness. Negative for headaches.  All other systems reviewed and are negative.     Allergies  Penicillins; Contrast media; and Sulfa antibiotics  Home Medications   Prior to Admission medications   Medication Sig Start Date End Date Taking? Authorizing Provider  aspirin EC 81 MG tablet Take 81 mg by mouth daily.    Historical Provider, MD  clonazePAM (KLONOPIN) 0.5 MG tablet Take 0.5 mg by mouth 2 (two) times daily as needed. anxiety    Historical Provider, MD  potassium chloride SA (K-DUR,KLOR-CON) 20 MEQ tablet Take 1 tablet (20 mEq total) by mouth 2 (two) times daily. 03/06/11 03/05/12  Dione Booze, MD   BP 126/79 mmHg  Pulse 77  Temp(Src) 98.3 F (36.8 C) (Oral)  Resp 13  Ht 5\' 6"  (1.676 m)  Wt 130 lb (58.968 kg)  BMI 20.99 kg/m2  SpO2 100% Physical Exam  Constitutional: She is oriented to person, place, and time. She appears well-developed and well-nourished.  HENT:  Head: Normocephalic and atraumatic.  Right Ear: External ear normal.  Left Ear: External ear normal.  Nose: Nose normal.  Eyes: Right eye exhibits no discharge. Left eye exhibits no discharge.  Cardiovascular: Normal rate, regular rhythm, normal heart sounds and intact distal pulses.  No murmur heard. Pulses:      Radial pulses are 2+ on the right side, and 2+ on the left side.       Dorsalis pedis pulses are 2+ on the right side, and 2+ on the left side.  Pulmonary/Chest: Effort normal and breath sounds normal. She has no wheezes. She has no rales.  Mild tenderness in multiple locations of left breast but no erythema, swelling or induration. No obvious masses  Abdominal: Soft. There is no tenderness.  Musculoskeletal:       Left shoulder: She exhibits no tenderness.       Left elbow: No tenderness found.  Upper extremities are  equal in color and both have brisk cap refill. No coolness or abnormal pulses. L hand appears to be held in a claw like pose.  Neurological: She is alert and oriented to person, place, and time.  Equal strength in upper extremities. LUE with increased tone. Able to bend joints but takes mild, constant pressure. She can move LUE freely. LLE appears weaker than right. She can lift it off the bed but it takes great effort and is then easily pushed down with little resistance. RLE normal.  Skin: Skin is warm and dry.  Nursing note and vitals reviewed.   ED Course  Procedures (including critical care time) Labs Review Labs Reviewed  CBC - Abnormal; Notable for the following:    WBC 10.8 (*)    All other components within normal limits  URINE RAPID DRUG SCREEN, HOSP PERFORMED - Abnormal; Notable for the following:    Tetrahydrocannabinol POSITIVE (*)    All other components within normal limits  BASIC METABOLIC PANEL  ETHANOL  I-STAT TROPOININ, ED    Imaging Review Dg Chest 2 View  02/09/2015  CLINICAL DATA:  Chest pain EXAM: CHEST  2 VIEW COMPARISON:  03/06/2011 FINDINGS: Normal heart size and mediastinal contours. No acute infiltrate or edema. No effusion or pneumothorax. No acute osseous findings. IMPRESSION: Negative chest. Electronically Signed   By: Marnee SpringJonathon  Watts M.D.   On: 02/09/2015 14:45   I have personally reviewed and evaluated these images and lab results as part of my medical decision-making.   EKG Interpretation   Date/Time:  Thursday February 09 2015 13:29:19 EST Ventricular Rate:  76 PR Interval:    QRS Duration: 80 QT Interval:  411 QTC Calculation: 462 R Axis:   19 Text Interpretation:  Atrial flutter with predominant 4:1 AV block  Anteroseptal infarct, age indeterminate nonspecific T waves unchanged  since 2013 Confirmed by Shauna Bodkins  MD, Keia Rask ([id number]) on 02/09/2015 2:11:11 PM      MDM   Final diagnoses:  Dystonia  Breast pain, left    Patient's chest pain  is quite atypical. Her tightness is gone at this time and has not been present today. She is having chronic left breast pain that she has had since getting a mammogram that was unremarkable in March of last year. On my breast exam I do not feel he obvious lumps or signs of an infection such as abscess. Her neuro exam is concerning for spasticity/dystonia. Discussed with neuro, Dr. Hosie PoissonSumner, who has evaluated patient and feels that she needs more test to evaluate whether or not his Parkinson's or some other neuromuscular disorder. Recommend starting her on baclofen at night. He feels that due to her insurance status she will did get most benefited by [facility]. Her PCP will help her with this. MRI pending, if negative,  will discharge with this plan. Care to Dr. Bebe Shaggy with MRI pending.    Pricilla Loveless, MD 02/09/15 [phone]

## 2015-02-09 NOTE — ED Notes (Signed)
Pt arrives from her PCP via [facility] c/o central chest pain tightness with numbness and tingling on the left arm. Pt states that she feel an aching sensation in her left elbow.

## 2015-02-09 NOTE — Consult Note (Signed)
NEURO HOSPITALIST CONSULT NOTE   Requestig physician: Dr. Criss Alvine   Reason for Consult: Dystonic posturing of left  HPI:                                                                                                                                          [name] is an 50 y.o. female presenting to hospital with 2 months of left arm tingling, left neck sidecomfort, increased tremor in both head and neck now with dystonic posturing of left hand. She has a family history of PD but is unclear what medications they were on and how they were diagnosed. Her husband states that her tremor and dystonia are very much like her fathers symptoms. She has seen Dr. Vickey Huger in the past for tremor and was prescribed a medication but is unclear the medication she was on.  She has no insurance and cannot afford the medications.   Past Medical History  Diagnosis Date  . Anxiety   . Panic attacks     Past Surgical History  Procedure Laterality Date  . Abdominal hysterectomy    . Adenoidectomy      Family History  Problem Relation Age of Onset  . Parkinson's disease Father     Social History:  reports that she has quit smoking. She has never used smokeless tobacco. She reports that she uses illicit drugs (Cocaine and Marijuana). She reports that she does not drink alcohol.  Allergies  Allergen Reactions  . Penicillins Anaphylaxis  . Contrast Media [Iodinated Diagnostic Agents] Other (See Comments)    unknown  . Sulfa Antibiotics Rash    MEDICATIONS:                                                                                                                     Current Facility-Administered Medications  Medication Dose Route Frequency Provider Last Rate Last Dose  . LORazepam (ATIVAN) injection 2 mg  2 mg Intravenous Once Pricilla Loveless, MD       Current Outpatient Prescriptions  Medication Sig Dispense Refill  . acetaminophen (TYLENOL) 325 MG tablet Take  650 mg by mouth every 6 (six) hours as needed for mild pain.    . potassium chloride SA (K-DUR,KLOR-CON) 20 MEQ tablet Take 1 tablet (20 mEq  total) by mouth 2 (two) times daily. 20 tablet 0      ROS:                                                                                                                                       History obtained from the patient  General ROS: negative for - chills, fatigue, fever, night sweats, weight gain or weight loss Psychological ROS: negative for - behavioral disorder, hallucinations, memory difficulties, mood swings or suicidal ideation Ophthalmic ROS: negative for - blurry vision, double vision, eye pain or loss of vision ENT ROS: negative for - epistaxis, nasal discharge, oral lesions, sore throat, tinnitus or vertigo Allergy and Immunology ROS: negative for - hives or itchy/watery eyes Hematological and Lymphatic ROS: negative for - bleeding problems, bruising or swollen lymph nodes Endocrine ROS: negative for - galactorrhea, hair pattern changes, polydipsia/polyuria or temperature intolerance Respiratory ROS: negative for - cough, hemoptysis, shortness of breath or wheezing Cardiovascular ROS: negative for - chest pain, dyspnea on exertion, edema or irregular heartbeat Gastrointestinal ROS: negative for - abdominal pain, diarrhea, hematemesis, nausea/vomiting or stool incontinence Genito-Urinary ROS: negative for - dysuria, hematuria, incontinence or urinary frequency/urgency Musculoskeletal ROS: negative for - joint swelling or muscular weakness Neurological ROS: as noted in HPI Dermatological ROS: negative for rash and skin lesion changes   Blood pressure 143/89, pulse 77, temperature 98.3 F (36.8 C), temperature source Oral, resp. rate 13, height 5\' 6"  (1.676 m), weight 58.968 kg (130 lb), SpO2 99 %.   Neurologic Examination:                                                                                                      HEENT-   Normocephalic, no lesions, without obvious abnormality.  Normal external eye and conjunctiva.  Normal TM's bilaterally.  Normal auditory canals and external ears. Normal external nose, mucus membranes and septum.  Normal pharynx. Cardiovascular- S1, S2 normal, pulses palpable throughout   Lungs- chest clear, no wheezing, rales, normal symmetric air entry Abdomen- normal findings: bowel sounds normal Extremities- no edema Lymph-no adenopathy palpable Musculoskeletal-no joint tenderness, deformity or swelling Skin-warm and dry, no hyperpigmentation, vitiligo, or suspicious lesions  Neurological Examination Mental Status: Alert, oriented, thought content appropriate.  Speech fluent without evidence of aphasia.  Able to follow 3 step commands without difficulty. Cranial Nerves: II: Discs flat bilaterally; Visual fields grossly normal, pupils equal, round, reactive to light and accommodation III,IV, VI: ptosis not present, extra-ocular motions intact bilaterally  V,VII: smile symmetric, facial light touch sensation normal bilaterally VIII: hearing normal bilaterally IX,X: uvula rises symmetrically XI: bilateral shoulder shrug XII: midline tongue extension Motor: Right : Upper extremity   5/5    Left:     Upper extremity   4/5 with flexed dystonic posturing  Lower extremity   5/5     Lower extremity   4/5 -slow movements of LUE, increased tone, no true bradykinesia --neck Torticollis, spasmodic head tremor Sensory: Pinprick and light touch intact throughout, bilaterally Deep Tendon Reflexes: 2+ and symmetric throughout Plantars: Right: downgoing   Left: downgoing Cerebellar: Bilateral intention tremor, difficult to complete on LUE Gait: slow with initiation.       Lab Results: Basic Metabolic Panel:  Recent Labs Lab 02/09/15 1519  NA 141  K 3.9  CL 109  CO2 23  GLUCOSE 98  BUN 7  CREATININE 0.73  CALCIUM 9.1    Liver Function Tests: No results for input(s): AST, ALT,  ALKPHOS, BILITOT, PROT, ALBUMIN in the last 168 hours. No results for input(s): LIPASE, AMYLASE in the last 168 hours. No results for input(s): AMMONIA in the last 168 hours.  CBC:  Recent Labs Lab 02/09/15 1400  WBC 10.8*  HGB 14.5  HCT 42.4  MCV 96.1  PLT 201    Cardiac Enzymes: No results for input(s): CKTOTAL, CKMB, CKMBINDEX, TROPONINI in the last 168 hours.  Lipid Panel: No results for input(s): CHOL, TRIG, HDL, CHOLHDL, VLDL, LDLCALC in the last 168 hours.  CBG: No results for input(s): GLUCAP in the last 168 hours.  Microbiology: No results found for this or any previous visit.  Coagulation Studies: No results for input(s): LABPROT, INR in the last 72 hours.  Imaging: Dg Chest 2 View  02/09/2015  CLINICAL DATA:  Chest pain EXAM: CHEST  2 VIEW COMPARISON:  03/06/2011 FINDINGS: Normal heart size and mediastinal contours. No acute infiltrate or edema. No effusion or pneumothorax. No acute osseous findings. IMPRESSION: Negative chest. Electronically Signed   By: Marnee SpringJonathon  Watts M.D.   On: 02/09/2015 14:45       Assessment and plan per attending neurologist  Felicie Mornavid Smith [name] [facility] [phone]  02/09/2015, 4:33 PM   Assessment/Plan: 50 YO female with left arm weakness and paresthesias which has worsened over the past 1-2 months. 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 Unclear etiology of her symptoms. Will check MRI brain and C spine for possible structural cause. She notes a strong family history of similar symptoms raising the possibility of a genetic component. Of note, she has been told her symptoms are potentially related to PD. At this time her presentation is not fully consistent with PD and I would hold on initiating treatment for PD.   She would likely benefit from Botox injections but this may not be possible due to her lack of insurance. Can try baclofen for potential symptomatic relief. If MRI  imaging unremarkable then I would suggest [facility] where she may have more potential treatment and evaluation options in an academic center.    Elspeth Choeter Davonta Stroot, DO [facility] [phone]  If 7pm- 7am, please page neurology on call as listed in AMION.

## 2015-02-09 NOTE — ED Provider Notes (Signed)
Discussed MRI results with patient She is awake/alert, well appearing, no distress Advised outpatient followup   Ashley Rhineonald Dawanda Mapel, MD 02/09/15 2138

## 2015-02-09 NOTE — Discharge Instructions (Signed)
Call your primary doctor for a close follow-up appointment. You'll need your primary care provider to refer you to [facility]. It is important to get into this neurologist decision can for further testing of why you are having these arm symptoms.

## 2015-02-13 ENCOUNTER — Other Ambulatory Visit: Payer: Self-pay | Admitting: Family Medicine

## 2015-02-13 DIAGNOSIS — N6320 Unspecified lump in the left breast, unspecified quadrant: Secondary | ICD-10-CM

## 2015-02-14 ENCOUNTER — Telehealth: Payer: Self-pay | Admitting: Cardiovascular Disease

## 2015-02-14 NOTE — Telephone Encounter (Signed)
Received records from [facility] for appointment on 03/28/15 with Dr Allyson SabalBerry.  [facility] for Dr Hazle CocaBerry's schedule on 03/28/15.  lp

## 2015-03-09 ENCOUNTER — Other Ambulatory Visit: Payer: PRIVATE HEALTH INSURANCE

## 2015-03-17 ENCOUNTER — Ambulatory Visit: Payer: PRIVATE HEALTH INSURANCE | Admitting: Cardiovascular Disease

## 2015-03-28 ENCOUNTER — Ambulatory Visit: Payer: PRIVATE HEALTH INSURANCE | Admitting: Cardiovascular Disease

## 2015-04-17 ENCOUNTER — Ambulatory Visit: Payer: Self-pay | Admitting: Family Medicine

## 2015-04-17 ENCOUNTER — Encounter: Payer: Self-pay | Admitting: Family Medicine

## 2015-04-17 ENCOUNTER — Telehealth: Payer: Self-pay | Admitting: Family Medicine

## 2015-04-17 VITALS — BP 122/83 | HR 94 | Temp 98.8°F | Ht 66.0 in | Wt 134.8 lb

## 2015-04-17 DIAGNOSIS — R300 Dysuria: Secondary | ICD-10-CM

## 2015-04-17 DIAGNOSIS — N3000 Acute cystitis without hematuria: Secondary | ICD-10-CM

## 2015-04-17 LAB — MICROSCOPIC EXAMINATION
Epithelial Cells (non renal): >10 /hpf — ABNORMAL HIGH (ref 0–10)
RBC MICROSCOPIC, UA: NONE SEEN /HPF (ref 0–?)

## 2015-04-17 LAB — URINALYSIS, COMPLETE
Bilirubin, UA: NEGATIVE
GLUCOSE, UA: NEGATIVE
KETONES UA: NEGATIVE
LEUKOCYTES UA: NEGATIVE
NITRITE UA: NEGATIVE
Protein, UA: NEGATIVE
SPEC GRAV UA: 1.015 (ref 1.005–1.030)
Urobilinogen, Ur: 0.2 mg/dL (ref 0.2–1.0)
pH, UA: 6 (ref 5.0–7.5)

## 2015-04-17 MED ORDER — ONDANSETRON 4 MG PO TBDP: 4.0000 mg | ORAL_TABLET | Freq: Three times a day (TID) | ORAL | Status: DC | PRN

## 2015-04-17 MED ORDER — CIPROFLOXACIN HCL 500 MG PO TABS: 500.0000 mg | ORAL_TABLET | Freq: Two times a day (BID) | ORAL | Status: DC

## 2015-04-17 NOTE — Telephone Encounter (Signed)
Take with food and send for zofran 4 odt tid prn

## 2015-04-17 NOTE — Progress Notes (Signed)
BP 122/83 mmHg  Pulse 94  Temp(Src) 98.8 F (37.1 C) (Oral)  Ht 5\' 6"  (1.676 m)  Wt 134 lb 12.8 oz (61.145 kg)  BMI 21.77 kg/m2   Subjective:    Patient ID: Ashley GibsonSabrenna A Pressman, female    DOB: 09/11/1965, 50 y.o.   MRN: [id number]  HPI: [name] is a 50 y.o. female presenting on 04/17/2015 for Chills; Hematuria; Bloated; and Back Pain   HPI Dysuria and abdominal pain Patient has had dysuria and urinary frequency and hematuria has been going on for the past 5 days. She is also had some burning and irritation at the introitus. She denies any fevers or chills but has had some achiness and malaise. She denies any nausea vomiting diarrhea or constipation. She has had UTIs and pyelonephritis previously.  Relevant past medical, surgical, family and social history reviewed and updated as indicated. Interim medical history since our last visit reviewed. Allergies and medications reviewed and updated.  Review of Systems  Constitutional: Negative for fever and chills.  HENT: Negative for congestion, ear discharge and ear pain.   Eyes: Negative for redness and visual disturbance.  Respiratory: Negative for chest tightness and shortness of breath.   Cardiovascular: Negative for chest pain and leg swelling.  Gastrointestinal: Positive for abdominal pain. Negative for nausea, vomiting and diarrhea.  Genitourinary: Positive for dysuria, urgency, frequency and hematuria. Negative for flank pain, decreased urine volume, vaginal bleeding, vaginal discharge, difficulty urinating, vaginal pain and pelvic pain.  Musculoskeletal: Negative for back pain and gait problem.  Skin: Negative for rash.  Neurological: Negative for light-headedness and headaches.  Psychiatric/Behavioral: Negative for behavioral problems and agitation.  All other systems reviewed and are negative.   Per HPI unless specifically indicated above     Medication List       This list is accurate as of: 04/17/15 12:05  PM.  Always use your most recent med list.               ciprofloxacin 500 MG tablet  Commonly known as:  CIPRO  Take 1 tablet (500 mg total) by mouth 2 (two) times daily.     clonazePAM 0.5 MG tablet  Commonly known as:  KLONOPIN  Take 0.5 mg by mouth daily.     potassium chloride SA 20 MEQ tablet  Commonly known as:  K-DUR,KLOR-CON  Take 1 tablet (20 mEq total) by mouth 2 (two) times daily.           Objective:    BP 122/83 mmHg  Pulse 94  Temp(Src) 98.8 F (37.1 C) (Oral)  Ht 5\' 6"  (1.676 m)  Wt 134 lb 12.8 oz (61.145 kg)  BMI 21.77 kg/m2  Wt Readings from Last 3 Encounters:  04/17/15 134 lb 12.8 oz (61.145 kg)  02/09/15 130 lb (58.968 kg)  03/06/11 140 lb (63.504 kg)    Physical Exam  Constitutional: She is oriented to person, place, and time. She appears well-developed and well-nourished. No distress.  Eyes: Conjunctivae and EOM are normal. Pupils are equal, round, and reactive to light.  Cardiovascular: Normal rate, regular rhythm, normal heart sounds and intact distal pulses.   No murmur heard. Pulmonary/Chest: Effort normal and breath sounds normal. No respiratory distress. She has no wheezes.  Abdominal: Soft. Bowel sounds are normal. She exhibits no distension. There is tenderness in the suprapubic area. There is no rigidity, no rebound, no guarding, no CVA tenderness, no tenderness at McBurney's point and negative Murphy's sign.  Musculoskeletal: Normal  range of motion. She exhibits no edema or tenderness.  Neurological: She is alert and oriented to person, place, and time. Coordination normal.  Skin: Skin is warm and dry. No rash noted. She is not diaphoretic.  Psychiatric: She has a normal mood and affect. Her behavior is normal.  Nursing note and vitals reviewed.      Assessment & Plan:   Problem List Items Addressed This Visit    None    Visit Diagnoses    Dysuria    -  Primary    Relevant Medications    ciprofloxacin (CIPRO) 500 MG tablet     Other Relevant Orders    Urinalysis, Complete    Acute cystitis without hematuria        Relevant Medications    ciprofloxacin (CIPRO) 500 MG tablet       Follow up plan: Return if symptoms worsen or fail to improve.  Counseling provided for all of the vaccine components Orders Placed This Encounter  Procedures  . Urinalysis, Complete    Arville Care, MD [facility] 04/17/2015, 12:05 PM

## 2015-04-17 NOTE — Telephone Encounter (Signed)
Patient aware.

## 2015-04-18 ENCOUNTER — Other Ambulatory Visit (HOSPITAL_COMMUNITY): Payer: Self-pay

## 2015-04-18 ENCOUNTER — Emergency Department (HOSPITAL_COMMUNITY): Payer: Self-pay

## 2015-04-18 ENCOUNTER — Emergency Department (HOSPITAL_COMMUNITY)
Admission: EM | Admit: 2015-04-18 | Discharge: 2015-04-18 | Disposition: A | Discharge status: Home / Self Care | Payer: Self-pay | Attending: Emergency Medicine | Admitting: Emergency Medicine

## 2015-04-18 ENCOUNTER — Encounter (HOSPITAL_COMMUNITY): Payer: Self-pay | Admitting: Emergency Medicine

## 2015-04-18 DIAGNOSIS — R109 Unspecified abdominal pain: Secondary | ICD-10-CM

## 2015-04-18 DIAGNOSIS — Z79899 Other long term (current) drug therapy: Secondary | ICD-10-CM | POA: Insufficient documentation

## 2015-04-18 DIAGNOSIS — R52 Pain, unspecified: Secondary | ICD-10-CM

## 2015-04-18 DIAGNOSIS — R112 Nausea with vomiting, unspecified: Secondary | ICD-10-CM | POA: Insufficient documentation

## 2015-04-18 DIAGNOSIS — Z87891 Personal history of nicotine dependence: Secondary | ICD-10-CM | POA: Insufficient documentation

## 2015-04-18 DIAGNOSIS — M549 Dorsalgia, unspecified: Secondary | ICD-10-CM | POA: Insufficient documentation

## 2015-04-18 DIAGNOSIS — R319 Hematuria, unspecified: Secondary | ICD-10-CM | POA: Insufficient documentation

## 2015-04-18 DIAGNOSIS — N83202 Unspecified ovarian cyst, left side: Secondary | ICD-10-CM | POA: Insufficient documentation

## 2015-04-18 LAB — CBC WITH DIFFERENTIAL/PLATELET
BASOS ABS: 0 10*3/uL (ref 0.0–0.1)
Basophils Relative: 0 %
EOS ABS: 0 10*3/uL (ref 0.0–0.7)
Eosinophils Relative: 0 %
HCT: 38.2 % (ref 36.0–46.0)
HEMOGLOBIN: 13.3 g/dL (ref 12.0–15.0)
LYMPHS ABS: 1.7 10*3/uL (ref 0.7–4.0)
LYMPHS PCT: 17 %
MCH: 33.3 pg (ref 26.0–34.0)
MCHC: 34.8 g/dL (ref 30.0–36.0)
MCV: 95.5 fL (ref 78.0–100.0)
Monocytes Absolute: 0.8 10*3/uL (ref 0.1–1.0)
Monocytes Relative: 8 %
NEUTROS PCT: 75 %
Neutro Abs: 7.5 10*3/uL (ref 1.7–7.7)
Platelets: 182 10*3/uL (ref 150–400)
RBC: 4 MIL/uL (ref 3.87–5.11)
RDW: 12.5 % (ref 11.5–15.5)
WBC: 10 10*3/uL (ref 4.0–10.5)

## 2015-04-18 LAB — COMPREHENSIVE METABOLIC PANEL
ALK PHOS: 67 U/L (ref 38–126)
ALT: 16 U/L (ref 14–54)
AST: 18 U/L (ref 15–41)
Albumin: 4.3 g/dL (ref 3.5–5.0)
Anion gap: 8 (ref 5–15)
BUN: 11 mg/dL (ref 6–20)
CALCIUM: 8.9 mg/dL (ref 8.9–10.3)
CHLORIDE: 106 mmol/L (ref 101–111)
CO2: 23 mmol/L (ref 22–32)
CREATININE: 0.81 mg/dL (ref 0.44–1.00)
GFR calc non Af Amer: >60 mL/min (ref >60)
GLUCOSE: 118 mg/dL — AB (ref 65–99)
Potassium: 3.4 mmol/L — ABNORMAL LOW (ref 3.5–5.1)
SODIUM: 137 mmol/L (ref 135–145)
Total Bilirubin: 1.1 mg/dL (ref 0.3–1.2)
Total Protein: 7.2 g/dL (ref 6.5–8.1)

## 2015-04-18 LAB — WET PREP, GENITAL
SPERM: NONE SEEN
TRICH WET PREP: NONE SEEN
YEAST WET PREP: NONE SEEN

## 2015-04-18 LAB — URINE MICROSCOPIC-ADD ON: RBC / HPF: NONE SEEN RBC/hpf (ref 0–5)

## 2015-04-18 LAB — URINALYSIS, ROUTINE W REFLEX MICROSCOPIC
BILIRUBIN URINE: NEGATIVE
GLUCOSE, UA: NEGATIVE mg/dL
HGB URINE DIPSTICK: NEGATIVE
Ketones, ur: 40 mg/dL — AB
Leukocytes, UA: NEGATIVE
Nitrite: NEGATIVE
PH: 5 (ref 5.0–8.0)

## 2015-04-18 LAB — LIPASE, BLOOD: Lipase: 32 U/L (ref 11–51)

## 2015-04-18 MED ORDER — HYDROCODONE-ACETAMINOPHEN 5-325 MG PO TABS
1.0000 | ORAL_TABLET | Freq: Four times a day (QID) | ORAL | Status: DC | PRN
Start: 2015-04-18 — End: 2015-04-24

## 2015-04-18 MED ORDER — KETOROLAC TROMETHAMINE 30 MG/ML IJ SOLN
30.0000 mg | Freq: Once | INTRAMUSCULAR | Status: AC
  Administered 2015-04-18: 30 mg via INTRAVENOUS
  Filled 2015-04-18: qty 1

## 2015-04-18 MED ORDER — ONDANSETRON HCL 4 MG/2ML IJ SOLN
4.0000 mg | Freq: Once | INTRAMUSCULAR | Status: AC
  Administered 2015-04-18: 4 mg via INTRAVENOUS
  Filled 2015-04-18: qty 2

## 2015-04-18 MED ORDER — IBUPROFEN 800 MG PO TABS: 800.0000 mg | ORAL_TABLET | Freq: Three times a day (TID) | ORAL | Status: DC | PRN

## 2015-04-18 MED ORDER — SODIUM CHLORIDE 0.9 % IV BOLUS (SEPSIS)
1000.0000 mL | Freq: Once | INTRAVENOUS | Status: AC
  Administered 2015-04-18: 1000 mL via INTRAVENOUS

## 2015-04-18 MED ORDER — PROMETHAZINE HCL 25 MG PO TABS: 25.0000 mg | ORAL_TABLET | Freq: Four times a day (QID) | ORAL | Status: DC | PRN

## 2015-04-18 MED ORDER — MORPHINE SULFATE (PF) 4 MG/ML IV SOLN
4.0000 mg | Freq: Once | INTRAVENOUS | Status: AC
  Administered 2015-04-18: 4 mg via INTRAVENOUS
  Filled 2015-04-18: qty 1

## 2015-04-18 NOTE — ED Notes (Signed)
Pt told that she cannot drive herself home due to morphine administration at 0447, pt verbalized understanding of these instructions and is currently attempting to find ride.

## 2015-04-18 NOTE — ED Notes (Signed)
Pt made aware to return if symptoms worsen or if any life threatening symptoms occur.   

## 2015-04-18 NOTE — ED Provider Notes (Signed)
TIME SEEN: 1:30 AM  CHIEF COMPLAINT: Fever, nausea, vomiting, abdominal pain, back pain, hematuria  HPI: Pt is a 50 y.o. female with history of anxiety and previous bilateral nephritis who presents to the emergency department with 3 weeks of intermittent fever, 2 weeks of intermittent lower abdominal pain that radiates into her back and hematuria, nausea vomiting started today. States she was seen at [facility] yesterday and diagnosed with urinary tract infection/kidney infection and started on Cipro. Reports she has taken 3 doses today but has vomited up every one of them. She says his hysterectomy and previous ovarian cystectomy. Denies vaginal bleeding or discharge. Denies dysuria. Denies alleviating factors. States that it hurts to walk around. Last fever was 103.9 yesterday.  Denies cough, sore throat, current headache, neck pain or neck stiffness, rash. No recent travel. No known sick contact.  ROS: See HPI Constitutional:  fever  Eyes: no drainage  ENT: no runny nose   Cardiovascular:  no chest pain  Resp: no SOB  GI:  vomiting GU: no dysuria Integumentary: no rash  Allergy: no hives  Musculoskeletal: no leg swelling  Neurological: no slurred speech ROS otherwise negative  PAST MEDICAL HISTORY/PAST SURGICAL HISTORY:  Past Medical History  Diagnosis Date  . Anxiety   . Panic attacks     MEDICATIONS:  Prior to Admission medications   Medication Sig Start Date End Date Taking? Authorizing Provider  ciprofloxacin (CIPRO) 500 MG tablet Take 1 tablet (500 mg total) by mouth 2 (two) times daily. 04/17/15   Elige RadonJoshua A Dettinger, MD  clonazePAM (KLONOPIN) 0.5 MG tablet Take 0.5 mg by mouth daily.    Historical Provider, MD  ondansetron (ZOFRAN ODT) 4 MG disintegrating tablet Take 1 tablet (4 mg total) by mouth every 8 (eight) hours as needed for nausea or vomiting. 04/17/15   Elige RadonJoshua A Dettinger, MD  potassium chloride SA (K-DUR,KLOR-CON) 20 MEQ tablet Take 1  tablet (20 mEq total) by mouth 2 (two) times daily. 03/06/11 03/05/12  Dione Boozeavid Glick, MD    ALLERGIES:  Allergies  Allergen Reactions  . Penicillins Anaphylaxis  . Contrast Media [Iodinated Diagnostic Agents] Other (See Comments)    unknown  . Sulfa Antibiotics Rash    SOCIAL HISTORY:  Social History  Substance Use Topics  . Smoking status: Former Games developermoker  . Smokeless tobacco: Never Used  . Alcohol Use: No    FAMILY HISTORY: Family History  Problem Relation Age of Onset  . Parkinson's disease Father     EXAM: BP 135/83 mmHg  Pulse 83  Temp(Src) 99.5 F (37.5 C) (Oral)  Resp 17  Ht 5\' 6"  (1.676 m)  Wt 135 lb (61.236 kg)  BMI 21.80 kg/m2  SpO2 99% CONSTITUTIONAL: Alert and oriented and responds appropriately to quesappears uncomfortable, warm to touch HEAD: Normocephalic EYES: Conjunctivae clear, PERRL ENT: normal nose; no rhinorrhea; dry mucous membranes NECK: Supple, no meningismus, no LAD  CARD: RRR; S1 and S2 appreciated; no murmurs, no clicks, no rubs, no gallops RESP: Normal chest excursion without splinting or tachypnea; breath sounds clear and equal bilaterally; no wheezes, no rhonchi, no rales, no hypoxia or respiratory distress, speaking full sentences ABD/GI: Normal bowel sounds; non-distended; soft, tender to palpation throughout the lower abdomen with voluntary guarding, no rebound, no peritoneal signs GU:  Normal external genitalia. No lesions, rashes noted. Patient has no vaginal bleeding on exam. Minimal vaginal discharge.  Patient has mild bilateral adnexal tenderness without fullness but she is diffusely tender throughout the lower abdomen anywhere  that I touch. She is status post hysterectomy and has a vaginal cuff. No cervix appreciated. Significant amount of pain when I put the speculum into her vagina or do a bimanual exam. Chaperone present for exam. BACK:  The back appears normal and is non-tender to palpation, there is no CVA tenderness EXT: Normal ROM  in all joints; non-tender to palpation; no edema; normal capillary refill; no cyanosis, no calf tenderness or swelling    SKIN: Normal color for age and race; warm; no rash NEURO: Moves all extremities equally, sensation to light touch intact diffusely, cranial nerves II through XII intact PSYCH: The patient's mood and manner are appropriate. Grooming and personal hygiene are appropriate.  MEDICAL DECISION MAKING:  Patient here with lower abdominal pain, back pain. Recently diagnosed with UTI. On antibiotics but cannot keep them down. Differential includes pyelonephritis, UTI, appendicitis, colitis, TOA.  We'll give IV fluids, pain and nausea medicine. We'll obtain labs, urine and a CT of her abdomen and pelvis.  ED PROGRESS: 4:40 AM Patient's labs are unremarkable. Her urine shows few bacteria but also many squamous cells and no other sign of infection. CT of her abdomen and pelvis shows normal appendix, normal kidneys, normal bladder, normal adnexa, normal bowel. She is still complaining of significant amount of lower abdominal pain. We have performed a pelvic exam she does have a lot of pain when you insert the speculum or do bimanual exam that she states is new for her. She also has adnexal tenderness bilaterally without fullness. Status post hysterectomy. She has no cervix. Minimal vaginal discharge. Will proceed with transvaginal ultrasound to evaluate her adnexa. We'll give second dose of pain medicine. She reports her vomiting and nausea have improved.    6:40 AM  patient's wet prep is positive for clue cells. She has no significant vaginal discharge or odor on exam. Do not feel this needs to be treated. She states her hysterectomy was when she was 50 years old. She has a vaginal cuff. I feel it is very unlikely that she would develop TOA or PID. Her ultrasound does not visualize the ovary. There is a 2.9 cm left adnexal cyst with some free fluid in the left pelvis that could represent a ruptured  cyst. Patient states she has had a history of ovarian cyst before. Discussed with her that given her right ovary was not visualized it is unlikely that there is a large mass or cyst. Discussed with patient that if this was a torsion that this ovary would not be salvageable because she has had symptoms for 2 weeks. Her pain is not completely localized in the right lower abdomen but is diffusely throughout the lower abdomen. Her adnexa on the right side appeared normal on CT imaging today. She reports feeling much better. Discussed with her that an ovarian cyst would not explain why she is having fever at home. She has not had any fever in the emergency department. No leukocytosis. I have advised her to continue her Cipro for her UTI. We will send a urine culture today. Will discharge patient with pain and nausea medicine. She is drinking without difficulty in the ED. Have recommended that she follow-up with the primary care physician who prescribed her Cipro in one week. Discussed return precautions. She verbalized understanding and is comfortable with this plan. We'll also give her OB/GYN follow-up.    Layla Maw Padraig Nhan, DO 04/18/15 [phone]

## 2015-04-18 NOTE — ED Notes (Signed)
Pt states she has been vomiting tonight. Pt was diagnosed with uti but cannot keep meds down.

## 2015-04-18 NOTE — ED Notes (Signed)
Pt reports that son is going to pick pt up in 30 minutes.

## 2015-04-18 NOTE — Discharge Instructions (Signed)
Please follow-up with your primary care physician in one week. Please follow-up with your OB/GYN for your ovarian cyst. You were found to have a 2.9 cm cyst on her left ovary. This may be contributing to some of your pain but is likely not the reason you're having fevers. Given your primary care physician diagnosed you with a urinary tract infection I recommend you continue your Cipro. Her urine here does not show any obvious infection but we have sent a urine culture. I do not think that this is a torsion (twisting of the blood supply) of the right ovary but the right ovary was not visualized. If this was a torsion, given you have had symptoms for 2 weeks this ovary would not be salvageable. You should follow-up with an OB/GYN closely.  If you develop worsening pain, fevers despite being on Cipro, vomiting and cannot stop, any other symptoms concerning to you, please return to the hospital.   Abdominal Pain, Adult Many things can cause abdominal pain. Usually, abdominal pain is not caused by a disease and will improve without treatment. It can often be observed and treated at home. Your health care provider will do a physical exam and possibly order blood tests and X-rays to help determine the seriousness of your pain. However, in many cases, more time must pass before a clear cause of the pain can be found. Before that point, your health care provider may not know if you need more testing or further treatment. HOME CARE INSTRUCTIONS Monitor your abdominal pain for any changes. The following actions may help to alleviate any discomfort you are experiencing:  Only take over-the-counter or prescription medicines as directed by your health care provider.  Do not take laxatives unless directed to do so by your health care provider.  Try a clear liquid diet (broth, tea, or water) as directed by your health care provider. Slowly move to a bland diet as tolerated. SEEK MEDICAL CARE IF:  You have unexplained  abdominal pain.  You have abdominal pain associated with nausea or diarrhea.  You have pain when you urinate or have a bowel movement.  You experience abdominal pain that wakes you in the night.  You have abdominal pain that is worsened or improved by eating food.  You have abdominal pain that is worsened with eating fatty foods.  You have a fever. SEEK IMMEDIATE MEDICAL CARE IF:  Your pain does not go away within 2 hours.  You keep throwing up (vomiting).  Your pain is felt only in portions of the abdomen, such as the right side or the left lower portion of the abdomen.  You pass bloody or black tarry stools. MAKE SURE YOU:  Understand these instructions.  Will watch your condition.  Will get help right away if you are not doing well or get worse.   This information is not intended to replace advice given to you by your health care provider. Make sure you discuss any questions you have with your health care provider.   Document Released: 10/31/2004 Document Revised: 10/12/2014 Document Reviewed: 09/30/2012 Elsevier Interactive Patient Education 2016 Elsevier Inc.  Nausea and Vomiting Nausea is a sick feeling that often comes before throwing up (vomiting). Vomiting is a reflex where stomach contents come out of your mouth. Vomiting can cause severe loss of body fluids (dehydration). Children and elderly adults can become dehydrated quickly, especially if they also have diarrhea. Nausea and vomiting are symptoms of a condition or disease. It is important to find  the cause of your symptoms. CAUSES   Direct irritation of the stomach lining. This irritation can result from increased acid production (gastroesophageal reflux disease), infection, food poisoning, taking certain medicines (such as nonsteroidal anti-inflammatory drugs), alcohol use, or tobacco use.  Signals from the brain.These signals could be caused by a headache, heat exposure, an inner ear disturbance, increased  pressure in the brain from injury, infection, a tumor, or a concussion, pain, emotional stimulus, or metabolic problems.  An obstruction in the gastrointestinal tract (bowel obstruction).  Illnesses such as diabetes, hepatitis, gallbladder problems, appendicitis, kidney problems, cancer, sepsis, atypical symptoms of a heart attack, or eating disorders.  Medical treatments such as chemotherapy and radiation.  Receiving medicine that makes you sleep (general anesthetic) during surgery. DIAGNOSIS Your caregiver may ask for tests to be done if the problems do not improve after a few days. Tests may also be done if symptoms are severe or if the reason for the nausea and vomiting is not clear. Tests may include:  Urine tests.  Blood tests.  Stool tests.  Cultures (to look for evidence of infection).  X-rays or other imaging studies. Test results can help your caregiver make decisions about treatment or the need for additional tests. TREATMENT You need to stay well hydrated. Drink frequently but in small amounts.You may wish to drink water, sports drinks, clear broth, or eat frozen ice pops or gelatin dessert to help stay hydrated.When you eat, eating slowly may help prevent nausea.There are also some antinausea medicines that may help prevent nausea. HOME CARE INSTRUCTIONS   Take all medicine as directed by your caregiver.  If you do not have an appetite, do not force yourself to eat. However, you must continue to drink fluids.  If you have an appetite, eat a normal diet unless your caregiver tells you differently.  Eat a variety of complex carbohydrates (rice, wheat, potatoes, bread), lean meats, yogurt, fruits, and vegetables.  Avoid high-fat foods because they are more difficult to digest.  Drink enough water and fluids to keep your urine clear or pale yellow.  If you are dehydrated, ask your caregiver for specific rehydration instructions. Signs of dehydration may  include:  Severe thirst.  Dry lips and mouth.  Dizziness.  Dark urine.  Decreasing urine frequency and amount.  Confusion.  Rapid breathing or pulse. SEEK IMMEDIATE MEDICAL CARE IF:   You have blood or brown flecks (like coffee grounds) in your vomit.  You have black or bloody stools.  You have a severe headache or stiff neck.  You are confused.  You have severe abdominal pain.  You have chest pain or trouble breathing.  You do not urinate at least once every 8 hours.  You develop cold or clammy skin.  You continue to vomit for longer than 24 to 48 hours.  You have a fever. MAKE SURE YOU:   Understand these instructions.  Will watch your condition.  Will get help right away if you are not doing well or get worse.   This information is not intended to replace advice given to you by your health care provider. Make sure you discuss any questions you have with your health care provider.   Document Released: 01/21/2005 Document Revised: 04/15/2011 Document Reviewed: 06/20/2010 Elsevier Interactive Patient Education 2016 Elsevier Inc. Ovarian Cyst An ovarian cyst is a fluid-filled sac that forms on an ovary. The ovaries are small organs that produce eggs in women. Various types of cysts can form on the ovaries. Most are  not cancerous. Many do not cause problems, and they often go away on their own. Some may cause symptoms and require treatment. Common types of ovarian cysts include:  Functional cysts--These cysts may occur every month during the menstrual cycle. This is normal. The cysts usually go away with the next menstrual cycle if the woman does not get pregnant. Usually, there are no symptoms with a functional cyst.  Endometrioma cysts--These cysts form from the tissue that lines the uterus. They are also called "chocolate cysts" because they become filled with blood that turns brown. This type of cyst can cause pain in the lower abdomen during intercourse and  with your menstrual period.  Cystadenoma cysts--This type develops from the cells on the outside of the ovary. These cysts can get very big and cause lower abdomen pain and pain with intercourse. This type of cyst can twist on itself, cut off its blood supply, and cause severe pain. It can also easily rupture and cause a lot of pain.  Dermoid cysts--This type of cyst is sometimes found in both ovaries. These cysts may contain different kinds of body tissue, such as skin, teeth, hair, or cartilage. They usually do not cause symptoms unless they get very big.  Theca lutein cysts--These cysts occur when too much of a certain hormone (human chorionic gonadotropin) is produced and overstimulates the ovaries to produce an egg. This is most common after procedures used to assist with the conception of a baby (in vitro fertilization). CAUSES   Fertility drugs can cause a condition in which multiple large cysts are formed on the ovaries. This is called ovarian hyperstimulation syndrome.  A condition called polycystic ovary syndrome can cause hormonal imbalances that can lead to nonfunctional ovarian cysts. SIGNS AND SYMPTOMS  Many ovarian cysts do not cause symptoms. If symptoms are present, they may include:  Pelvic pain or pressure.  Pain in the lower abdomen.  Pain during sexual intercourse.  Increasing girth (swelling) of the abdomen.  Abnormal menstrual periods.  Increasing pain with menstrual periods.  Stopping having menstrual periods without being pregnant. DIAGNOSIS  These cysts are commonly found during a routine or annual pelvic exam. Tests may be ordered to find out more about the cyst. These tests may include:  Ultrasound.  X-ray of the pelvis.  CT scan.  MRI.  Blood tests. TREATMENT  Many ovarian cysts go away on their own without treatment. Your health care provider may want to check your cyst regularly for 2-3 months to see if it changes. For women in menopause, it is  particularly important to monitor a cyst closely because of the higher rate of ovarian cancer in menopausal women. When treatment is needed, it may include any of the following:  A procedure to drain the cyst (aspiration). This may be done using a long needle and ultrasound. It can also be done through a laparoscopic procedure. This involves using a thin, lighted tube with a tiny camera on the end (laparoscope) inserted through a small incision.  Surgery to remove the whole cyst. This may be done using laparoscopic surgery or an open surgery involving a larger incision in the lower abdomen.  Hormone treatment or birth control pills. These methods are sometimes used to help dissolve a cyst. HOME CARE INSTRUCTIONS   Only take over-the-counter or prescription medicines as directed by your health care provider.  Follow up with your health care provider as directed.  Get regular pelvic exams and Pap tests. SEEK MEDICAL CARE IF:  Your periods are late, irregular, or painful, or they stop.  Your pelvic pain or abdominal pain does not go away.  Your abdomen becomes larger or swollen.  You have pressure on your bladder or trouble emptying your bladder completely.  You have pain during sexual intercourse.  You have feelings of fullness, pressure, or discomfort in your stomach.  You lose weight for no apparent reason.  You feel generally ill.  You become constipated.  You lose your appetite.  You develop acne.  You have an increase in body and facial hair.  You are gaining weight, without changing your exercise and eating habits.  You think you are pregnant. SEEK IMMEDIATE MEDICAL CARE IF:   You have increasing abdominal pain.  You feel sick to your stomach (nauseous), and you throw up (vomit).  You develop a fever that comes on suddenly.  You have abdominal pain during a bowel movement.  Your menstrual periods become heavier than usual. MAKE SURE YOU:  Understand these  instructions.  Will watch your condition.  Will get help right away if you are not doing well or get worse.   This information is not intended to replace advice given to you by your health care provider. Make sure you discuss any questions you have with your health care provider.   Document Released: 01/21/2005 Document Revised: 01/26/2013 Document Reviewed: 09/28/2012 Elsevier Interactive Patient Education [employer]2016 Elsevier Inc.

## 2015-04-19 LAB — GC/CHLAMYDIA PROBE AMP (~~LOC~~) NOT AT ARMC
Chlamydia: NEGATIVE
Neisseria Gonorrhea: NEGATIVE

## 2015-04-20 LAB — URINE CULTURE

## 2015-04-24 ENCOUNTER — Encounter: Payer: Self-pay | Admitting: Pediatrics

## 2015-04-24 ENCOUNTER — Ambulatory Visit: Payer: Self-pay | Admitting: Pediatrics

## 2015-04-24 VITALS — BP 136/75 | HR 77 | Temp 98.3°F | Ht 66.0 in | Wt 130.6 lb

## 2015-04-24 DIAGNOSIS — B9689 Other specified bacterial agents as the cause of diseases classified elsewhere: Secondary | ICD-10-CM

## 2015-04-24 DIAGNOSIS — A499 Bacterial infection, unspecified: Secondary | ICD-10-CM

## 2015-04-24 DIAGNOSIS — B029 Zoster without complications: Secondary | ICD-10-CM

## 2015-04-24 DIAGNOSIS — N760 Acute vaginitis: Secondary | ICD-10-CM

## 2015-04-24 MED ORDER — ACYCLOVIR 800 MG PO TABS: 800.0000 mg | ORAL_TABLET | Freq: Every day | ORAL | Status: DC

## 2015-04-24 MED ORDER — METRONIDAZOLE 500 MG PO TABS
500.0000 mg | ORAL_TABLET | Freq: Two times a day (BID) | ORAL | Status: DC
Start: 2015-04-24 — End: 2015-05-01

## 2015-04-24 NOTE — Progress Notes (Signed)
    Subjective:    Patient ID: [name], female    DOB: 08/29/1965, 50 y.o.   MRN: [id number]  CC: Rash   HPI: Ashley GibsonSabrenna A Vanwey is a 50 y.o. female presenting for Rash  Started two days ago, vesicles on lower R side of back/near tail bone Very itchy, also hurts Has had shingles before, was in the same area before  Husband cheated on her, multple new exposures, was tested in the ED a few days ago for genital irritation, tested for GC/chlamydia due to itching.   Relevant past medical, surgical, family and social history reviewed and updated as indicated. Interim medical history since our last visit reviewed. Allergies and medications reviewed and updated.    ROS: Per HPI unless specifically indicated above  History  Smoking status  . Former Smoker  Smokeless tobacco  . Never Used        Objective:    BP 136/75 mmHg  Pulse 77  Temp(Src) 98.3 F (36.8 C) (Oral)  Ht 5\' 6"  (1.676 m)  Wt 130 lb 9.6 oz (59.24 kg)  BMI 21.09 kg/m2  Wt Readings from Last 3 Encounters:  04/24/15 130 lb 9.6 oz (59.24 kg)  04/18/15 135 lb (61.236 kg)  04/17/15 134 lb 12.8 oz (61.145 kg)     Gen: NAD, alert, cooperative with exam, NCAT EYES: EOMI, no scleral injection or icterus CV: NRRR, normal S1/S2, no murmur Resp: CTABL, no wheezes, normal WOB Abd: +BS, soft, NTND. Neuro: Alert and oriented Psych: slightly tearful, full affect Skin: several small vesicles on lower back, slightly to R of midline     Assessment & Plan:    Smith RobertSabrenna was seen today for rash. Asked about results from recent ED visit, she was positive for BV, says didn't yet get treatment. Will treat as below. Shingles symptoms started 2 days ago.   Diagnoses and all orders for this visit:  Bacterial vaginosis -     metroNIDAZOLE (FLAGYL) 500 MG tablet; Take 1 tablet (500 mg total) by mouth 2 (two) times daily.  Shingles -     acyclovir (ZOVIRAX) 800 MG tablet; Take 1 tablet (800 mg total) by mouth 5  (five) times daily. For 7 days   Follow up plan: Return in about 4 months (around 08/24/2015).  Rex Krasarol Vincent, MD [facility] 04/24/2015, 11:25 AM

## 2015-05-01 ENCOUNTER — Encounter: Payer: Self-pay | Admitting: Physician Assistant

## 2015-05-01 ENCOUNTER — Ambulatory Visit: Payer: Self-pay | Admitting: Physician Assistant

## 2015-05-01 ENCOUNTER — Other Ambulatory Visit: Payer: Self-pay | Admitting: Physician Assistant

## 2015-05-01 VITALS — BP 102/70 | HR 66 | Temp 98.4°F | Ht 64.0 in | Wt 128.2 lb

## 2015-05-01 DIAGNOSIS — R251 Tremor, unspecified: Secondary | ICD-10-CM

## 2015-05-01 DIAGNOSIS — Z131 Encounter for screening for diabetes mellitus: Secondary | ICD-10-CM

## 2015-05-01 DIAGNOSIS — N63 Unspecified lump in unspecified breast: Secondary | ICD-10-CM

## 2015-05-01 DIAGNOSIS — G249 Dystonia, unspecified: Secondary | ICD-10-CM

## 2015-05-01 DIAGNOSIS — Z13220 Encounter for screening for lipoid disorders: Secondary | ICD-10-CM

## 2015-05-01 DIAGNOSIS — R300 Dysuria: Secondary | ICD-10-CM

## 2015-05-01 LAB — POCT URINALYSIS DIPSTICK
Bilirubin, UA: NEGATIVE
Glucose, UA: NEGATIVE
Ketones, UA: NEGATIVE
Leukocytes, UA: NEGATIVE
NITRITE UA: NEGATIVE
PH UA: 5
PROTEIN UA: NEGATIVE
RBC UA: NEGATIVE
UROBILINOGEN UA: 0.2

## 2015-05-01 LAB — GLUCOSE, POCT (MANUAL RESULT ENTRY): POC GLUCOSE: 108 mg/dL — AB (ref 70–99)

## 2015-05-01 NOTE — Progress Notes (Signed)
BP 102/70 mmHg  Pulse 66  Temp(Src) 98.4 F (36.9 C)  Ht 5\' 4"  (1.626 m)  Wt 128 lb 4 oz (58.174 kg)  BMI 22.00 kg/m2  SpO2 99%   Subjective:    Patient ID: [name], female    DOB: 05/21/1965, 50 y.o.   MRN: [id number]  HPI: [name] is a 50 y.o. female presenting on 05/01/2015 for New Patient (Initial Visit); Urinary Tract Infection; Arm Pain; and Breast Mass   HPI   Chief Complaint  Patient presents with  . New Patient (Initial Visit)    [facility], but can no longer afford to go there  . Urinary Tract Infection    began taking antibiotics for it on 3/20, but has not been helpful. pt believes antibiotics are causing diarrhea  . 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 pt states she does not have much use of her arm, has tingling, and pain  . Breast Mass    L breast. pt states she is due for a mammo     [facility] but stopped going there b/c she doesn't have insurance.  She has been on cipro and flagyl and acyclovir. She stopped everything b/c she had diarrhea.  She took cipro for a week.  She took about half of her flagyl (guess based on pills remaining in bottle).  Her diarrhea has stopped.   She has been seen multiple times recently for R flank pain  And B lower quad pain.  [facility] and once in ER.   US shows L ovarian cyst.  CT abd/pelvis nl.   Urine cx done 3/14 showed lactobacillus.   [facility] MH issues.  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   Upon further questioning, pt had really not established with [facility].   She said she was treated there for uti but ER said she didn't have one.    Pt had been to [facility].   Pt states she was sent to ER for dystonia.  She said [facility] still thinks it still has blockages.   She said she had disputes with both places and that is why she isn't going back to either place.    She also called [facility] to see about going to neuro.    Pt went to neuro in [place] (?Dr Vickey Hugerohmeier) about 6 year ago for tremors and she says she  was given some pills and was told it would eventually turn into parkinson's.   Pt also saw neuro in ER recently- on 02/09/15  Pt noticed new knot in L breast she noticed "a couple months ago".  She says it was after chrismtas but can't be more specific about how long.    Last mammo was 04/28/14 and was normal.  Relevant past medical, surgical, family and social history reviewed and updated as indicated. Interim medical history since our last visit reviewed. Allergies and medications reviewed and updated.    Current outpatient prescriptions:  .  clonazePAM (KLONOPIN) 0.5 MG tablet, Take 0.5 mg by mouth 2 (two) times daily as needed. , Disp: , Rfl:    Review of Systems  Constitutional: Positive for chills and fatigue. Negative for fever, diaphoresis, appetite change and unexpected weight change.  HENT: Positive for dental problem. Negative for congestion, drooling, ear pain, facial swelling, hearing loss, mouth sores, sneezing, sore throat, trouble swallowing and voice change.   Eyes: Negative for pain, discharge, redness, itching and visual disturbance.  Respiratory: Negative for cough, choking, shortness of breath and wheezing.   Cardiovascular: Negative for chest pain, palpitations and leg swelling.  Gastrointestinal: Positive for abdominal pain. Negative for vomiting, diarrhea, constipation and blood in stool.  Endocrine: Negative for cold intolerance, heat intolerance and polydipsia.  Genitourinary: Positive for dysuria. Negative for hematuria and decreased urine volume.  Musculoskeletal: Positive for back pain. Negative for arthralgias and gait problem.  Skin: Negative for rash.  Allergic/Immunologic: Negative for environmental allergies.   Neurological: Negative for seizures, syncope, light-headedness and headaches.  Hematological: Negative for adenopathy.  Psychiatric/Behavioral: Positive for dysphoric mood. Negative for suicidal ideas and agitation. The patient is nervous/anxious.     Per HPI unless specifically indicated above     Objective:    BP 102/70 mmHg  Pulse 66  Temp(Src) 98.4 F (36.9 C)  Ht  (1.626 m)  Wt 128 lb 4 oz (58.174 kg)  BMI 22.00 kg/m2  SpO2 99%  Wt Readings from Last 3 Encounters:  05/01/15 128 lb 4 oz (58.174 kg)  04/24/15 130 lb 9.6 oz (59.24 kg)  04/18/15 135 lb (61.236 kg)    Physical Exam  Constitutional: She is oriented to person, place, and time. She appears well-developed and well-nourished.  HENT:  Head: Normocephalic and atraumatic.  Mouth/Throat: Oropharynx is clear and moist. No oropharyngeal exudate.  Eyes: Conjunctivae and EOM are normal. Pupils are equal, round, and reactive to light.  Neck: Neck supple. No thyromegaly present.  Cardiovascular: Normal rate and regular rhythm.   Pulmonary/Chest: Effort normal and breath sounds normal.  Abdominal: Soft. Bowel sounds are normal. She exhibits no mass. There is no hepatosplenomegaly. There is no tenderness.  Genitourinary: No breast swelling, tenderness, discharge or bleeding.  L breast lump 3 o'clock position.  R breast exam normal  Musculoskeletal: She exhibits no edema.  Lymphadenopathy:    She has no cervical adenopathy.  Neurological: She is alert and oriented to person, place, and time. She displays tremor. Gait normal.  Skin: Skin is warm and dry.  Psychiatric: She has a normal mood and affect. Her behavior is normal.  Vitals reviewed.    Results for orders placed or performed in visit on 05/01/15  POCT Glucose (CBG)  Result Value Ref Range   POC Glucose 108 (A) 70 - 99 mg/dl      Assessment & Plan:    Encounter Diagnoses  Name Primary?  . Dysuria   . Screening for diabetes mellitus Yes  .  Screening cholesterol level   . Tremor   . Breast mass   . Dystonia     -Send urine for culture -Check labs -Refer to [facility] for diagnostic mammogram -Gave [facility] discount application and refer to neuro (cervical dystonia and apparent dystonic posturing) -F/u 2 weeks

## 2015-05-03 LAB — CULTURE, URINE COMPREHENSIVE
COLONY COUNT: NO GROWTH
ORGANISM ID, BACTERIA: NO GROWTH

## 2015-05-04 ENCOUNTER — Other Ambulatory Visit (HOSPITAL_COMMUNITY): Payer: Self-pay | Admitting: *Deleted

## 2015-05-04 DIAGNOSIS — N6459 Other signs and symptoms in breast: Secondary | ICD-10-CM

## 2015-05-04 DIAGNOSIS — N644 Mastodynia: Secondary | ICD-10-CM

## 2015-05-05 DIAGNOSIS — N63 Unspecified lump in unspecified breast: Secondary | ICD-10-CM | POA: Insufficient documentation

## 2015-05-05 DIAGNOSIS — G249 Dystonia, unspecified: Secondary | ICD-10-CM | POA: Insufficient documentation

## 2015-05-05 DIAGNOSIS — R300 Dysuria: Secondary | ICD-10-CM | POA: Insufficient documentation

## 2015-05-05 DIAGNOSIS — R251 Tremor, unspecified: Secondary | ICD-10-CM | POA: Insufficient documentation

## 2015-05-05 LAB — LIPID PANEL
Cholesterol: 150 mg/dL (ref 125–200)
HDL: 59 mg/dL (ref >=46)
LDL CALC: 78 mg/dL (ref <130)
TRIGLYCERIDES: 65 mg/dL (ref <150)
Total CHOL/HDL Ratio: 2.5 Ratio (ref <=5.0)
VLDL: 13 mg/dL (ref <30)

## 2015-05-05 LAB — CREATININE, SERUM: Creat: 0.56 mg/dL (ref 0.50–1.10)

## 2015-05-05 LAB — HEMOGLOBIN A1C
Hgb A1c MFr Bld: 5.3 % (ref <5.7)
Mean Plasma Glucose: 105 mg/dL

## 2015-05-05 LAB — TSH: TSH: 1.8 mIU/L

## 2015-05-09 ENCOUNTER — Ambulatory Visit (HOSPITAL_COMMUNITY)
Admission: RE | Admit: 2015-05-09 | Discharge: 2015-05-09 | Disposition: A | Discharge status: Home / Self Care | Payer: PRIVATE HEALTH INSURANCE | Source: Ambulatory Visit | Attending: *Deleted | Admitting: *Deleted

## 2015-05-09 DIAGNOSIS — N644 Mastodynia: Secondary | ICD-10-CM | POA: Insufficient documentation

## 2015-05-09 DIAGNOSIS — N6459 Other signs and symptoms in breast: Secondary | ICD-10-CM

## 2015-05-15 ENCOUNTER — Encounter: Payer: Self-pay | Admitting: Physician Assistant

## 2015-05-15 ENCOUNTER — Ambulatory Visit: Payer: Self-pay | Admitting: Physician Assistant

## 2015-05-15 VITALS — BP 114/78 | HR 63 | Temp 97.9°F | Ht 66.0 in | Wt 135.2 lb

## 2015-05-15 DIAGNOSIS — R251 Tremor, unspecified: Secondary | ICD-10-CM

## 2015-05-15 DIAGNOSIS — G249 Dystonia, unspecified: Secondary | ICD-10-CM

## 2015-05-15 NOTE — Progress Notes (Signed)
BP 114/78 mmHg  Pulse 63  Temp(Src) 97.9 F (36.6 C)  Ht  (1.676 m)  Wt 135 lb 3.2 oz (61.326 kg)  BMI 21.83 kg/m2  SpO2 99%   Subjective:    Patient ID: [name], female    DOB: 1965-07-30, 50 y.o.   MRN: [id number]  HPI: [name] is a 50 y.o. female presenting on 05/15/2015 for Dysuria; Breast Mass; and Spasms   HPI  Chief Complaint  Patient presents with  . Dysuria  . Breast Mass  . Spasms    dystonia    Pt has turned in her [employer] discount application Pt had [place] and mammogram.  She says to f/u 1 year for repeat.  Pt goes to [facility]  LUE hurts and tremors   Relevant past medical, surgical, family and social history reviewed and updated as indicated. Interim medical history since our last visit reviewed. Allergies and medications reviewed and updated.   Current outpatient prescriptions:  .  clonazePAM (KLONOPIN) 0.5 MG tablet, Take 0.5 mg by mouth 2 (two) times daily as needed. , Disp: , Rfl:    Review of Systems  Constitutional: Negative for fever, chills, diaphoresis, appetite change, fatigue and unexpected weight change.  HENT: Negative for congestion, dental problem, drooling, ear pain, facial swelling, hearing loss, mouth sores, sneezing, sore throat, trouble swallowing and voice change.   Eyes: Negative for pain, discharge, redness, itching and visual disturbance.  Respiratory: Negative for cough, choking, shortness of breath and wheezing.   Cardiovascular: Negative for chest pain, palpitations and leg swelling.  Gastrointestinal: Negative for vomiting, abdominal pain, diarrhea, constipation and blood in stool.  Endocrine: Negative for cold intolerance, heat intolerance and polydipsia.  Genitourinary: Negative for dysuria, hematuria and decreased urine volume.  Musculoskeletal: Negative for back pain, arthralgias and gait problem.  Skin: Negative for rash.  Allergic/Immunologic: Negative for environmental allergies.  Neurological:  Negative for seizures, syncope, light-headedness and headaches.  Hematological: Negative for adenopathy.  Psychiatric/Behavioral: Negative for suicidal ideas, dysphoric mood and agitation. The patient is not nervous/anxious.     Per HPI unless specifically indicated above     Objective:    BP 114/78 mmHg  Pulse 63  Temp(Src) 97.9 F (36.6 C)  Ht  (1.676 m)  Wt 135 lb 3.2 oz (61.326 kg)  BMI 21.83 kg/m2  SpO2 99%  Wt Readings from Last 3 Encounters:  05/15/15 135 lb 3.2 oz (61.326 kg)  05/01/15 128 lb 4 oz (58.174 kg)  04/24/15 130 lb 9.6 oz (59.24 kg)    Physical Exam  Constitutional: She is oriented to person, place, and time. She appears well-developed and well-nourished.  HENT:  Head: Normocephalic and atraumatic.  Neck: Neck supple.  Cardiovascular: Normal rate and regular rhythm.   Pulmonary/Chest: Effort normal and breath sounds normal.  Abdominal: Soft. Bowel sounds are normal. She exhibits no mass. There is no hepatosplenomegaly. There is no tenderness.  Musculoskeletal: She exhibits no edema.  Pt unable to fully extend LUE, L elbow, L wrist and hand  Lymphadenopathy:    She has no cervical adenopathy.  Neurological: She is alert and oriented to person, place, and time. She displays tremor.  Skin: Skin is warm and dry.  Psychiatric: She has a normal mood and affect. Her behavior is normal.  Vitals reviewed.       Assessment & Plan:   Encounter Diagnoses  Name Primary?  . Dystonia Yes  . Tremor     -reviewed labs with pt -  urine cx normal -schedule with neuro -f/u 6 months. RTO sooner prn

## 2015-06-01 ENCOUNTER — Encounter: Payer: Self-pay | Admitting: Neurology

## 2015-06-01 ENCOUNTER — Ambulatory Visit (INDEPENDENT_AMBULATORY_CARE_PROVIDER_SITE_OTHER): Payer: Self-pay | Admitting: Neurology

## 2015-06-01 VITALS — BP 142/92 | HR 68 | Resp 20 | Ht 66.0 in | Wt 127.0 lb

## 2015-06-01 DIAGNOSIS — G241 Genetic torsion dystonia: Secondary | ICD-10-CM

## 2015-06-01 DIAGNOSIS — G248 Other dystonia: Secondary | ICD-10-CM
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
## 2015-09-18 ENCOUNTER — Ambulatory Visit: Payer: Self-pay | Admitting: Physician Assistant

## 2015-09-20 ENCOUNTER — Ambulatory Visit: Payer: Self-pay | Admitting: Physician Assistant

## 2015-09-26 ENCOUNTER — Ambulatory Visit: Payer: Self-pay | Admitting: Physician Assistant

## 2015-09-28 ENCOUNTER — Encounter: Payer: Self-pay | Admitting: Physician Assistant

## 2015-11-15 ENCOUNTER — Ambulatory Visit: Payer: Self-pay | Admitting: Physician Assistant

## 2015-11-15 ENCOUNTER — Encounter: Payer: Self-pay | Admitting: Physician Assistant

## 2015-11-15 VITALS — BP 124/82 | HR 67 | Temp 98.1°F | Ht 66.0 in | Wt 132.2 lb

## 2015-11-15 DIAGNOSIS — F39 Unspecified mood [affective] disorder: Secondary | ICD-10-CM

## 2015-11-15 DIAGNOSIS — G249 Dystonia, unspecified: Secondary | ICD-10-CM

## 2015-11-15 NOTE — Progress Notes (Signed)
BP 124/82 (BP Location: Right Arm, Patient Position: Sitting, Cuff Size: Normal)   Pulse 67   Temp 98.1 F (36.7 C) (Other (Comment))   Ht 5\' 6"  (1.676 m)   Wt 132 lb 3.2 oz (60 kg)   SpO2 99%   BMI 21.34 kg/m    Subjective:    Patient ID: [name], female    DOB: 09/09/1965, 50 y.o.   MRN: [id number]  HPI: Ashley GibsonSabrenna A Tory is a 50 y.o. female presenting on 11/15/2015 for Follow-up   HPI  Pt saw dr Vickey Hugerohmeier (neurology) in April and she [facility].  Pt says she was seen there July 28, 2015.  She says "they didn't do anything".   She says they offered her trial/research but pt says she just can't get to w-s regularly.  She says the specialist says there just isn't a lot known about cervical dystonia.    Pt states she feels burning pain.  She says that is the worst thing.    Pt out of her klonopin.  She says [facility] but doesn't like the doctor there  Relevant past medical, surgical, family and social history reviewed and updated as indicated. Interim medical history since our last visit reviewed. Allergies and medications reviewed and updated.   Current Outpatient Prescriptions:  .  clonazePAM (KLONOPIN) 0.5 MG tablet, Take 0.5 mg by mouth 2 (two) times daily as needed. , Disp: , Rfl:    Review of Systems  Constitutional: Positive for fatigue. Negative for appetite change, chills, diaphoresis, fever and unexpected weight change.  HENT: Negative for congestion, drooling, ear pain, facial swelling, hearing loss, mouth sores, sneezing, sore throat, trouble swallowing and voice change.   Eyes: Negative for pain, discharge, redness, itching and visual disturbance.  Respiratory: Negative for cough, choking, shortness of breath and wheezing.   Cardiovascular: Negative for chest pain, palpitations and leg swelling.  Gastrointestinal: Negative for abdominal pain, blood in stool, constipation, diarrhea and vomiting.  Endocrine: Negative for cold  intolerance, heat intolerance and polydipsia.  Genitourinary: Negative for decreased urine volume, dysuria and hematuria.  Musculoskeletal: Positive for arthralgias. Negative for back pain and gait problem.  Skin: Negative for rash.  Allergic/Immunologic: Negative for environmental allergies.  Neurological: Negative for seizures, syncope, light-headedness and headaches.  Hematological: Negative for adenopathy.  Psychiatric/Behavioral: Positive for dysphoric mood. Negative for agitation and suicidal ideas. The patient is nervous/anxious.     Per HPI unless specifically indicated above     Objective:    BP 124/82 (BP Location: Right Arm, Patient Position: Sitting, Cuff Size: Normal)   Pulse 67   Temp 98.1 F (36.7 C) (Other (Comment))   Ht 5\' 6"  (1.676 m)   Wt 132 lb 3.2 oz (60 kg)   SpO2 99%   BMI 21.34 kg/m   Wt Readings from Last 3 Encounters:  11/15/15 132 lb 3.2 oz (60 kg)  06/01/15 127 lb (57.6 kg)  05/15/15 135 lb 3.2 oz (61.3 kg)    Physical Exam  Constitutional: She is oriented to person, place, and time. She appears well-developed and well-nourished.  HENT:  Head: Normocephalic and atraumatic.  Neck: Neck supple.  Cardiovascular: Normal rate and regular rhythm.   Pulmonary/Chest: Effort normal and breath sounds normal.  Abdominal: Soft. Bowel sounds are normal. She exhibits no mass. There is no hepatosplenomegaly. There is no tenderness.  Musculoskeletal: She exhibits no edema.  Lymphadenopathy:    She has no cervical adenopathy.  Neurological:  She is alert and oriented to person, place, and time. She displays tremor. Gait abnormal.  Skin: Skin is warm and dry.  Psychiatric: She has a normal mood and affect. Her behavior is normal.  Vitals reviewed.       Assessment & Plan:   Encounter Diagnoses  Name Primary?  . Dystonia Yes  . Mood disorder (HCC)     -pt given card for cardinal to call to get appointment scheduled with MH provider -encouraged pt to  contact [facility] for follow up with neurology  -follow up here in 4 months.  RTO sooner prn

## 2016-03-20 ENCOUNTER — Ambulatory Visit: Payer: Self-pay | Admitting: Physician Assistant

## 2016-03-21 ENCOUNTER — Encounter: Payer: Self-pay | Admitting: Physician Assistant

## 2017-07-09 ENCOUNTER — Encounter: Payer: Self-pay | Admitting: Neurology

## 2017-07-10 NOTE — Progress Notes (Deleted)
[name] was seen today in neurologic consultation at the request of Chuck HintBrashear, Allison, MD.  Her primary care provider is Jacquelin HawkingMcElroy, Shannon, [name].  The consultation is for the evaluation of "tremor."  Pt previously saw Chuck HintBrashear, Allison, MD and those notes are reviewed.  Pt has not been seen by Dr. Marya FossaBrashear for a year (06/2016).    Pt reports head and neck tremor for 13 or 14 years.  She was apparently seen by [facility] Vickey [facility]ier [facility] first and then [facility].   Records indicate had several neurologists prior to Dr. Vickey Hugerohmeier.  She did see Dr. Hosie PoissonSumner.  Records from [facility] indicate that the symptoms started in her neck and spread to her entire body and that pain was the biggest complaint.  Pain is described as over the entire body.  Reports her father has tremor, but she does not have a relationship with him.  Her father's sisters also have tremor as do many cousins.  DYT6, DYT1 and RDP dx were entertained.  Had evidence of dystonia in the left hand and left foot on examination.  Patient was placed on trihexyphenidyl.  She was enrolled in a trial for RDP (rapid onset dystonia with parkinsonism).    ***she is currently on klonopin and has been on that medication for many years.  botox has been recommended over the years but pt unable to afford.  The patient is a 52 y.o. year old female with a history of ***.  The first symptom began *** years ago.   Neuroimaging has *** previously been performed.  It *** available for my review today.  PREVIOUS MEDICATIONS: ***  ALLERGIES:   Allergies  Allergen Reactions  . Penicillins Anaphylaxis  . Contrast Media [Iodinated Diagnostic Agents] Other (See Comments)    unknown  . Sulfa Antibiotics Rash    CURRENT MEDICATIONS:  Outpatient Encounter Medications as of 07/14/2017  Medication Sig  . clonazePAM (KLONOPIN) 0.5 MG tablet Take 0.5 mg by mouth 2 (two) times daily as needed.    No facility-administered encounter  medications on file as of 07/14/2017.     PAST MEDICAL HISTORY:   Past Medical History:  Diagnosis Date  . Anxiety   . Depression   . Other specified cardiac arrhythmias 1990s  . Panic attacks   . Polyneuritis (HCC)   . PTSD (post-traumatic stress disorder)   . Tremors of nervous system     PAST SURGICAL HISTORY:   Past Surgical History:  Procedure Laterality Date  . ABDOMINAL HYSTERECTOMY  late 1990s  . ADENOIDECTOMY    . OVARIAN CYST REMOVAL      SOCIAL HISTORY:   Social History   Socioeconomic History  . Marital status: Married    Spouse name: Not on file  . Number of children: Not on file  . Years of education: Not on file  . Highest education level: Not on file  Occupational History  . Not on file  Social Needs  . Financial resource strain: Not on file  . Food insecurity:    Worry: Not on file    Inability: Not on file  . Transportation needs:    Medical: Not on file    Non-medical: Not on file  Tobacco Use  . Smoking status: Never Smoker  . Smokeless tobacco: Never Used  Substance and Sexual Activity  . 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 Drug use: Yes    Types:  Cocaine, Marijuana    Comment: no cocain since the 80's. occ marijuana (1 /mo)  . Sexual activity: Not on file  Lifestyle  . Physical activity:    Days per week: Not on file    Minutes per session: Not on file  . Stress: Not on file  Relationships  . Social connections:    Talks on phone: Not on file    Gets together: Not on file    Attends religious service: Not on file    Active member of club or organization: Not on file    Attends meetings of clubs or organizations: Not on file    Relationship status: Not on file  . Intimate partner violence:    Fear of current or ex partner: Not on file    Emotionally abused: Not on file    Physically abused: Not on file    Forced sexual activity: Not on file  Other Topics Concern  . Not on file  Social  History Narrative  . Not on file    FAMILY HISTORY:   Family Status  Relation Name Status  . Father  Alive  . Mother  Alive    ROS:  ROS  PHYSICAL EXAMINATION:    VITALS:  There were no vitals filed for this visit.  GEN:  Normal appears female in no acute distress.  Appears stated age. HEENT:  Normocephalic, atraumatic. The mucous membranes are moist. The superficial temporal arteries are without ropiness or tenderness. Cardiovascular: Regular rate and rhythm. Lungs: Clear to auscultation bilaterally. Neck/Heme: There are no carotid bruits noted bilaterally.  NEUROLOGICAL: Orientation:  The patient is alert and oriented x 3.  Fund of knowledge is appropriate.  Recent and remote memory intact.  Attention span and concentration normal.  Repeats and names without difficulty. Cranial nerves: There is good facial symmetry. The pupils are equal round and reactive to light bilaterally. Fundoscopic exam reveals clear disc margins bilaterally. Extraocular muscles are intact and visual fields are full to confrontational testing. Speech is fluent and clear. Soft palate rises symmetrically and there is no tongue deviation. Hearing is intact to conversational tone. Tone: Tone is good throughout. Sensation: Sensation is intact to light touch and pinprick throughout (facial, trunk, extremities). Vibration is intact at the bilateral big toe. There is no extinction with double simultaneous stimulation. There is no sensory dermatomal level identified. Coordination:  The patient has no difficulty with RAM's or FNF bilaterally. Motor: Strength is 5/5 in the bilateral upper and lower extremities.  Shoulder shrug is equal and symmetric. There is no pronator drift.  There are no fasciculations noted. DTR's: Deep tendon reflexes are 2/4 at the bilateral biceps, triceps, brachioradialis, patella and achilles.  Plantar responses are downgoing bilaterally. Gait and Station: The patient is able to ambulate  without difficulty. The patient is able to heel toe walk without any difficulty. The patient is able to ambulate in a tandem fashion. The patient is able to stand in the Romberg position.   IMPRESSION/PLAN  1. ***   Cc:  Jacquelin Hawking, [name]

## 2017-07-14 ENCOUNTER — Ambulatory Visit: Payer: Self-pay | Admitting: Neurology
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
## 2017-07-28 ENCOUNTER — Encounter: Payer: Self-pay | Admitting: Neurology

## 2017-07-28 ENCOUNTER — Ambulatory Visit (INDEPENDENT_AMBULATORY_CARE_PROVIDER_SITE_OTHER): Payer: Self-pay | Admitting: Neurology

## 2017-07-28 VITALS — BP 126/68 | HR 92 | Ht 65.0 in | Wt 119.0 lb

## 2017-07-28 DIAGNOSIS — F444 Conversion disorder with motor symptom or deficit: Secondary | ICD-10-CM

## 2017-07-28 MED ORDER — CARBIDOPA-LEVODOPA 25-100 MG PO TABS: 1.0000 | ORAL_TABLET | Freq: Every day | ORAL | 3 refills | Status: DC

## 2017-07-28 NOTE — Patient Instructions (Signed)
You can try carbidopa/levodopa 25/100, 1 tablet in the morning.  The biggest issue here is the need for counseling.  It was good to see you today!

## 2017-07-29 ENCOUNTER — Encounter: Payer: Self-pay | Admitting: Psychology

## 2017-07-29 NOTE — Progress Notes (Signed)
I made a referral for the patient to receive counseling services from [facility]. They provide outpatient therapy as well as some case management and peer support.  They see predominately folks that have trauma-related disorders, but don't necessarily have anyone on staff that specializes in psychogenic tremors. They do have experience in conversation disorders. This may be the best fit for counseling for the patient at this time in regards to resources in our area.   With the referral, I requested that they contact me if they need any help or more information that will be helpful to provide clinical services to the patient.I also notified them that the patient is self pay and needs access to a sliding scale fee if they offer this.   The patient is aware that the referral has been made and is interested in receiving services.  I have asked the patient to notify me  If this resource does not work out so we can ensure that we find something.

## 2017-09-09 ENCOUNTER — Encounter (HOSPITAL_COMMUNITY): Payer: Self-pay | Admitting: Emergency Medicine

## 2017-09-09 ENCOUNTER — Emergency Department (HOSPITAL_COMMUNITY): Payer: Self-pay

## 2017-09-09 ENCOUNTER — Emergency Department (HOSPITAL_COMMUNITY)
Admission: EM | Admit: 2017-09-09 | Discharge: 2017-09-09 | Disposition: A | Discharge status: Home / Self Care | Payer: Self-pay | Attending: Emergency Medicine | Admitting: Emergency Medicine

## 2017-09-09 DIAGNOSIS — R251 Tremor, unspecified: Secondary | ICD-10-CM | POA: Insufficient documentation

## 2017-09-09 DIAGNOSIS — Z79899 Other long term (current) drug therapy: Secondary | ICD-10-CM | POA: Insufficient documentation

## 2017-09-09 DIAGNOSIS — G249 Dystonia, unspecified: Secondary | ICD-10-CM | POA: Insufficient documentation

## 2017-09-09 LAB — COMPREHENSIVE METABOLIC PANEL
ALBUMIN: 4.3 g/dL (ref 3.5–5.0)
ALT: 104 U/L — ABNORMAL HIGH (ref 0–44)
ANION GAP: 12 (ref 5–15)
AST: 99 U/L — ABNORMAL HIGH (ref 15–41)
Alkaline Phosphatase: 73 U/L (ref 38–126)
BUN: 7 mg/dL (ref 6–20)
CALCIUM: 9.5 mg/dL (ref 8.9–10.3)
CO2: 27 mmol/L (ref 22–32)
Chloride: 105 mmol/L (ref 98–111)
Creatinine, Ser: 0.81 mg/dL (ref 0.44–1.00)
GFR calc Af Amer: >60 mL/min (ref >60)
GFR calc non Af Amer: >60 mL/min (ref >60)
GLUCOSE: 90 mg/dL (ref 70–99)
Potassium: 3.8 mmol/L (ref 3.5–5.1)
Sodium: 144 mmol/L (ref 135–145)
Total Bilirubin: 1 mg/dL (ref 0.3–1.2)
Total Protein: 6.7 g/dL (ref 6.5–8.1)

## 2017-09-09 LAB — CBC
HEMATOCRIT: 42 % (ref 36.0–46.0)
Hemoglobin: 13.4 g/dL (ref 12.0–15.0)
MCH: 31.5 pg (ref 26.0–34.0)
MCHC: 31.9 g/dL (ref 30.0–36.0)
MCV: 98.6 fL (ref 78.0–100.0)
Platelets: 178 10*3/uL (ref 150–400)
RBC: 4.26 MIL/uL (ref 3.87–5.11)
RDW: 11.9 % (ref 11.5–15.5)
WBC: 6.2 10*3/uL (ref 4.0–10.5)

## 2017-09-09 LAB — DIFFERENTIAL
Abs Immature Granulocytes: 0 10*3/uL (ref 0.0–0.1)
Basophils Absolute: 0 10*3/uL (ref 0.0–0.1)
Basophils Relative: 1 %
EOS PCT: 3 %
Eosinophils Absolute: 0.2 10*3/uL (ref 0.0–0.7)
IMMATURE GRANULOCYTES: 0 %
Lymphocytes Relative: 29 %
Lymphs Abs: 1.8 10*3/uL (ref 0.7–4.0)
MONO ABS: 0.4 10*3/uL (ref 0.1–1.0)
Monocytes Relative: 7 %
Neutro Abs: 3.8 10*3/uL (ref 1.7–7.7)
Neutrophils Relative %: 60 %

## 2017-09-09 LAB — I-STAT TROPONIN, ED: Troponin i, poc: 0 ng/mL (ref 0.00–0.08)

## 2017-09-09 LAB — PROTIME-INR
INR: 0.94
Prothrombin Time: 12.5 seconds (ref 11.4–15.2)

## 2017-09-09 LAB — APTT: aPTT: 31 seconds (ref 24–36)

## 2017-09-09 MED ORDER — DIAZEPAM 5 MG/ML IJ SOLN
5.0000 mg | Freq: Once | INTRAMUSCULAR | Status: AC
  Administered 2017-09-09: 5 mg via INTRAVENOUS
  Filled 2017-09-09: qty 2

## 2017-09-09 MED ORDER — ACETAMINOPHEN 500 MG PO TABS
1000.0000 mg | ORAL_TABLET | Freq: Once | ORAL | Status: AC
  Administered 2017-09-09: 1000 mg via ORAL
  Filled 2017-09-09: qty 2

## 2017-09-09 NOTE — ED Notes (Signed)
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  Has had same for years.  She reports pain in her L arm which she has been taking muscle relaxer without relief.  She is A&O x 4.  In NAD.  Family is at bedside.

## 2017-09-09 NOTE — ED Triage Notes (Signed)
Pt reports the she is having an episode of cerebral dystonia that started a couple days ago and is affecting her left arm and hand. She reports arm pain and states that she is unable to open her left hand when she has been able to open her thumb-middle finger previously.  Pt states that she called her neurologist and her PCP but was unable to get an appointment for several weeks and was sent here for evaluation.

## 2017-09-09 NOTE — ED Provider Notes (Signed)
[facility] EMERGENCY DEPARTMENT Provider Note   CSN: [id number] Arrival date & time: 09/09/17  1503     History   Chief Complaint Chief Complaint  Patient presents with  . Cerebral Dystonia    HPI [name] is a 52 y.o. female.  [name] is a 52 y.o. Female with a history of tremor, polyneuritis, PTSD, movement disorder, depression and anxiety, who presents to the emergency department for evaluation of "cervical dystonia" and contraction of the left hand and wrist.  It patient reports she has had cervical tremor for the past several years, and has had issues with her left hand and wrist locking up for the past 2 to 3 months.  She reports the hand locking up seem to be improving and she was able to get her first and second fingers fully straightened out but over the past few days these have locked up as well and she is unable to move any of the fingers in her hand.  She denies numbness or tingling in the hand, no discoloration.  No recent injury to the hand, neck or head.  She reports a mild headache which she gets intermittently, denies any associated vision changes, nausea, vomiting, dizziness.  She reports that at baseline she has some weakness on the left side which is unchanged.  She called her primary care doctor and neurologist and neither of them could get her into be seen until 8/21, they prescribed a low-dose muscle relaxer for the patient, which she has not been taking it she reports she is tried this in the past and it has not been helpful, she has not tried anything else to treat her symptoms.  Patient also complains of "burning pain" over her body which has been present constantly for the past 2 to 3 years.  Further review of patient's chart, reveals she is followed by Dr. Arbutus Leas [facility], patient was seen on 6/25, and exhibited very similar symptoms at the time with noted dystonia of the left hand, Dr. 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     Past Medical History:  Diagnosis Date  . Anxiety   . Depression   . Movement disorder   . Other specified cardiac arrhythmias 1990s  . Panic attacks   . Polyneuritis   . PTSD (post-traumatic stress disorder)   . Tremors of nervous system     Patient Active Problem List   Diagnosis Date Noted  . Tremor 05/05/2015  . Dystonia 05/05/2015  . Breast mass 05/05/2015  . Dysuria 05/05/2015    Past Surgical History:  Procedure Laterality Date  . ABDOMINAL HYSTERECTOMY  late 1990s  . ADENOIDECTOMY    . FOOT FRACTURE SURGERY Right   . OVARIAN CYST REMOVAL       OB History   None      Home Medications    Prior to Admission medications   Medication Sig Start Date End Date Taking? Authorizing Provider  carbidopa-levodopa (SINEMET IR) 25-100 MG tablet Take 1 tablet by mouth daily. 07/28/17   Tat, Octaviano Batty, DO  citalopram (CELEXA) 20 MG tablet Take 20 mg by mouth daily. 07/14/17   [provider]  clonazePAM (KLONOPIN) 0.5 MG tablet Take 0.5 mg by mouth  2 (two) times daily as needed.     [provider]    Family History Family History  Problem Relation Age of Onset  . Parkinson's disease Father   . Stroke Father   . Diabetes Mother   . Hypertension Mother   . Heart attack Maternal Aunt   . Heart attack Maternal Uncle   . Cancer Maternal Grandmother        ovarian cancer  . Heart attack Maternal Grandmother   . Cerebrovascular Accident Maternal Grandmother   . Heart attack Maternal Grandfather   . Cerebrovascular Accident Maternal Grandfather   . Stroke Paternal Grandmother   . Diabetes Paternal Grandmother   . Congestive Heart Failure Paternal Grandmother   . Stroke Paternal Grandfather   .  Parkinson's disease Paternal Grandfather   . Heart attack Maternal Uncle   . Movement disorder Sister   . Parkinson's disease Paternal Aunt        3 daughters also have Parkinsons  . Parkinson's disease Paternal Aunt   . Parkinson's disease Paternal Aunt   . Parkinson's disease Paternal Uncle     Social History Social History   Tobacco Use  . Smoking status: Never Smoker  . Smokeless tobacco: Never Used  Substance Use Topics  . 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 Drug use: Yes    Types: Cocaine, Marijuana    Comment: no cocain since the 80's. occ marijuana (1 /mo)     Allergies   Penicillins; Contrast media [iodinated diagnostic agents]; and Sulfa antibiotics   Review of Systems Review of Systems  Constitutional: Negative for chills and fever.  HENT: Negative for congestion, ear pain, hearing loss, rhinorrhea and sore throat.   Eyes: Negative for pain and visual disturbance.  Respiratory: Negative for cough and shortness of breath.   Cardiovascular: Negative for chest pain.  Gastrointestinal: Negative for abdominal pain, nausea and vomiting.  Genitourinary: Negative for dysuria and frequency.  Musculoskeletal: Positive for arthralgias. Negative for back pain, gait problem, joint swelling and neck pain.  Skin: Negative for color change, rash and wound.  Neurological: Positive for tremors. Negative for dizziness, seizures, syncope, speech difficulty, weakness, light-headedness, numbness and headaches.       Dystonia     Physical Exam Updated Vital Signs BP (!) 152/86 (BP Location: Right Arm)   Pulse 70   Temp 98.6 F (37 C) (Oral)   Ht 5\' 6"  (1.676 m)   Wt 53.5 kg (118 lb)   SpO2 100%   BMI 19.05 kg/m   Physical Exam  Constitutional: She appears well-developed and well-nourished. No distress.  Persistent tremor of the head and neck noted, patient intermittently tearful during exam.  HENT:  Head: Normocephalic and  atraumatic.  Mouth/Throat: Oropharynx is clear and moist.  Eyes: Pupils are equal, round, and reactive to light. EOM are normal. Right eye exhibits no discharge. Left eye exhibits no discharge.  No nystagmus  Neck: Normal range of motion. Neck supple.  Persistent tremor, but no tenderness to palpation, and normal range of motion  Cardiovascular: Normal rate, regular rhythm, normal heart sounds and intact distal pulses.  Pulmonary/Chest: Effort normal and breath sounds normal. No respiratory distress.  Respirations equal and unlabored, patient able to speak in full sentences, lungs clear to auscultation bilaterally  Abdominal: Soft. Bowel sounds are normal. She exhibits no distension and no mass. There is no tenderness. There is no guarding.  Abdomen soft, nondistended, nontender to palpation  in all quadrants without guarding or peritoneal signs  Musculoskeletal:  Left upper extremity with hand held in constant fist, and patient unable to relax, range of motion is also limited at the wrist, patient has normal range of motion at the elbow and shoulder, although palpable increased muscle tension in the forearm with some discomfort surrounding the elbow, no overlying erythema, warmth or swelling.  2+ radial pulse All other extremities with normal range of motion without increased muscle tone, and 2+ pulses throughout.  Neurological: She is alert. Coordination normal.  Speech is clear, able to follow commands CN III-XII intact Constant tremor of the head and neck noted Left hand held in constant grip with increased muscle tone, unable to actively or passively completely straighten the fingers or flex and extend the wrist, normal muscle tone of the upper arm with normal range of motion of the elbow and shoulder.,  Sensation intact to soft touch throughout the left hand and upper extremity. Right upper extremity with normal strength, muscle tone and sensation. Pt has difficulty with moving the left  lower extremity but is able to, dorsi and plantarflex and however the leg over the bed with bilateral lower extremities.  Skin: Skin is warm and dry. Capillary refill takes less than 2 seconds. She is not diaphoretic.  Psychiatric: She has a normal mood and affect. Her behavior is normal.  Nursing note and vitals reviewed.    ED Treatments / Results  Labs (all labs ordered are listed, but only abnormal results are displayed) Labs Reviewed  COMPREHENSIVE METABOLIC PANEL - Abnormal; Notable for the following components:      Result Value   AST 99 (*)    ALT 104 (*)    All other components within normal limits  PROTIME-INR  APTT  CBC  DIFFERENTIAL  I-STAT TROPONIN, ED    EKG None  Radiology Ct Head Wo Contrast  Result Date: 09/09/2017 CLINICAL DATA:  52 year old female with Parkinson's disease. 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 Initial encounter. EXAM: CT HEAD WITHOUT CONTRAST TECHNIQUE: Contiguous axial images were obtained from the base of the skull through the vertex without intravenous contrast. COMPARISON:  02/09/2015 brain MR. FINDINGS: Brain: No intracranial hemorrhage or CT evidence of large acute infarct. No intracranial mass lesion noted on this unenhanced exam. Mild posterior frontal-parietal lobe atrophy. Cervicomedullary junction, pituitary and pineal region unremarkable. Vascular: No hyperdense vessel. Skull: Negative. Sinuses/Orbits: No acute orbital abnormality. Polypoid opacification left maxillary sinus. Other: Mastoid air cells and middle ear cavities are clear. IMPRESSION: 1. No acute intracranial abnormality. 2. Mild posterior frontal-parietal lobe atrophy. 3. Polypoid opacification left maxillary sinus. Electronically Signed   By: Lacy DuverneySteven  Olson M.D.   On: 09/09/2017 17:07    Procedures Procedures (including critical care time)  Medications Ordered in ED Medications  diazepam (VALIUM) injection 5 mg (5 mg Intravenous Given 09/09/17 1902)    acetaminophen (TYLENOL) tablet 1,000 mg (1,000 mg Oral Given 09/09/17 2102)     Initial Impression / Assessment and Plan / ED Course  I have reviewed the triage vital signs and the nursing notes.  Pertinent labs & imaging results that were available during my care of the patient were reviewed by me and considered in my medical decision making (see chart for details).  Patient presents for evaluation of head and neck tremor and dystonia of the left hand and wrist, patient initially spoke as though this were a newer problem but it appears that the head and neck tremor has been going  on for many years and the dystonia in the left hand has been present for 2 to 3 months, waxing and waning in severity.  Patient with normal vitals and in no acute distress, persistent tremor of the head neck present on exam.  Patient's left hand appears to be contracted into a fist, only able to minimally straighten the fingers, sensation is intact, unable to move the wrist but normal range of motion at the elbow and shoulder.  No other acute neurologic deficits noted.  On review of patient's chart, neurology notes suggest that this is likely psychogenic and related to childhood trauma, patient has been undergoing counseling weekly but feels that her symptoms have worsened despite this.  Labs and head CT initiated from triage.  CT of the head without any acute intracranial abnormalities.  Abs very reassuring without leukocytosis, normal hemoglobin, and no acute electrolyte derangements, normal renal function.  Minimal elevation in liver function studies but patient is without abdominal pain, nausea or vomiting, and there is no focal right upper quadrant tenderness on exam.  Patient given Valium to see if this provides any symptomatic relief.  Discussed case with Dr. Laurence Slate with neurology who agrees that this is chronic in nature and likely psychogenic.  Agrees with the Valium and if this is not helping patient will need to  continue to follow-up with her primary care and neurologist.    Discussed reassuring work-up and neurologist recommendations with the patient and her family.  Minimal improvement with Valium.  On reevaluation she continues to have dystonia of the left hand, but when she is distracted and moving her other extremities I am able to almost completely straighten the fingers of the left hand.  Discussed using these distraction techniques at home.  Encourage patient to continue with counseling and to call and schedule follow-up with her primary care doctor and neurologist, and see if she can get on any sort of cancellation list.  Return precautions discussed with patient and family and they expressed understanding and are in agreement with this plan.  Final Clinical Impressions(s) / ED Diagnoses   Final diagnoses:  Tremor  Dystonia of left hand    ED Discharge Orders    None       Legrand Rams 09/10/17 Quentin Cornwall, MD 09/11/17 [phone]

## 2017-09-09 NOTE — Discharge Instructions (Signed)
Your evaluation today is reassuring, CT of your head and lab work overall look good.  This is a chronic problem and I would like for you to continue with your counseling and follow-up with Dr. Arbutus Leasat and your primary care doctor, I encourage you to call and see if they have a cancellation list available to see if you can be seen sooner.  I recommend using the low-dose muscle relaxer that they prescribed to you, and to use the distraction techniques that we talked about here in the ED today.  Return to the emergency department if you have worsening weakness or symptoms, or develop any symptoms on the right side of your body, have severe sudden onset headache, vision changes difficulty with speech or swallowing or any other new or concerning symptoms.

## 2017-09-22 NOTE — Progress Notes (Deleted)
[name] was seen today in neurologic consultation at the request of Jacquelin Hawking, [name].  Her primary care provider is Jacquelin Hawking, [name].  The consultation is for the evaluation of "tremor."  Pt previously saw Jacquelin Hawking, [name] and those notes are reviewed.  Pt has not been seen by Dr. Marya Fossa for a year (06/2016).    Pt reports head and neck tremor for 13 or 14 years per records but pt states that it started acutely in 2017.  She was apparently seen by Dr. Vickey Huger at [facility] neurolog[facility] first and then transferred her care to [facility].   Records indicate had several neurologists prior to Dr. Vickey Huger.  She did see Dr. Hosie Poisson.  Records from [facility] indicate that the symptoms started in her neck and spread to her entire body and that pain was the biggest complaint.  Pain is described as over the entire body.  States that she cannot differentiate one pain from another as it is all over.  Pain is burning.  records indicate her father has tremor (she reports PD to me), but she does not have a relationship with him.  Her father's sisters also have tremor as do many cousins.  DYT6, DYT1 and RDP dx were entertained.  Had evidence of dystonia in the left hand and left foot on examination.  Patient was placed on trihexyphenidyl.  States that she tried it 2 different times and she had nausea and sweating.   She was enrolled in a trial for RDP (rapid onset dystonia with parkinsonism).    She states that she didn't end up doing that.  States that it was too hard for her to get back and forth.  she is currently on klonopin and has been on that medication for many years.  She doesn't think it helps but takes it primarily for anxiety and panic attacks.  She did get started on celexa for this 2 weeks ago but states that is causing nausea as well.  botox has been recommended over the years but pt unable to afford.  She tried baclofen with Dr. Vickey Huger but thought that it caused nausea.  fam hx: sister  has tremor with unknown dx.  Father and paternal GF with PD.  3 of fathers sisters with PD and 1 of cousins with PD.  Neuroimaging has  previously been performed.  It is available for my review today.  I reviewed her MRI brain from 02/2015 and it was normal.   09/25/17 update: Patient is seen here today in follow-up for dystonia, likely psychogenic.  Records have been reviewed since her last visit.  I did give her a very small dose of levodopa last visit to see if that would be of any benefit.  She states that ***.  She was in the emergency room on September 09, 2017 for the same to symptoms that she presented to me with.  Notes from the ED physician indicate that she initially was not able to move her left hand out of a fist, but on reevaluation when distracted, hands/fingers were almost completely able to be straightened passively.  She reports that she has been working with a Paramedic.  PREVIOUS MEDICATIONS: artane, klonopin, baclofen  ALLERGIES:   Allergies  Allergen Reactions  . Penicillins Anaphylaxis  . Contrast Media [Iodinated Diagnostic Agents] Other (See Comments)    unknown  . Sulfa Antibiotics Rash    CURRENT MEDICATIONS:  Outpatient Encounter Medications as of 09/25/2017  Medication Sig  . carbidopa-levodopa (SINEMET IR)  25-100 MG tablet Take 1 tablet by mouth daily.  . citalopram (CELEXA) 20 MG tablet Take 20 mg by mouth daily.  . clonazePAM (KLONOPIN) 0.5 MG tablet Take 0.5 mg by mouth 2 (two) times daily as needed.    No facility-administered encounter medications on file as of 09/25/2017.     PAST MEDICAL HISTORY:   Past Medical History:  Diagnosis Date  . Anxiety   . Depression   . Movement disorder   . Other specified cardiac arrhythmias 1990s  . Panic attacks   . Polyneuritis   . PTSD (post-traumatic stress disorder)   . Tremors of nervous system     PAST SURGICAL HISTORY:   Past Surgical History:  Procedure Laterality Date  . ABDOMINAL HYSTERECTOMY  late  1990s  . ADENOIDECTOMY    . FOOT FRACTURE SURGERY Right   . OVARIAN CYST REMOVAL      SOCIAL HISTORY:   Social History   Socioeconomic History  . Marital status: Divorced    Spouse name: Not on file  . Number of children: 1  . Years of education: Not on file  . Highest education level: 12th grade  Occupational History  . Not on file  Social Needs  . Financial resource strain: Not on file  . Food insecurity:    Worry: Not on file    Inability: Not on file  . Transportation needs:    Medical: Not on file    Non-medical: Not on file  Tobacco Use  . Smoking status: Never Smoker  . Smokeless tobacco: Never Used  Substance and Sexual Activity  . 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 Drug use: Yes    Types: Cocaine, Marijuana    Comment: no cocain since the 80's. occ marijuana (1 /mo)  . Sexual activity: Not Currently  Lifestyle  . Physical activity:    Days per week: Not on file    Minutes per session: Not on file  . Stress: Not on file  Relationships  . Social connections:    Talks on phone: Not on file    Gets together: Not on file    Attends religious service: Not on file    Active member of club or organization: Not on file    Attends meetings of clubs or organizations: Not on file    Relationship status: Not on file  . Intimate partner violence:    Fear of current or ex partner: Not on file    Emotionally abused: Not on file    Physically abused: Not on file    Forced sexual activity: Not on file  Other Topics Concern  . Not on file  Social History Narrative   Patient is divorced, lives with her mother. She only occasionally drinks caffeine. Exercise is limited.    FAMILY HISTORY:   Family Status  Relation Name Status  . Father  Deceased  . Mother  Alive  . Mat Aunt  (Not Specified)  . Mat Uncle  (Not Specified)  . MGM  Deceased  . MGF  Deceased  . PGM  Deceased  . PGF  Deceased  . Mat Uncle  (Not Specified)  .  Sister  Alive  . Sister  Alive  . Cendant CorporationPat Aunt  Alive  . Cendant CorporationPat Aunt  Alive  . Cendant CorporationPat Aunt  Alive  . [employer] Uncle  Alive  . Son  Alive    ROS:  ROS  PHYSICAL EXAMINATION:  VITALS:   There were no vitals filed for this visit.  GEN:  Normal appears female in no acute distress.  Appears stated age.  Tearful at times HEENT:  Normocephalic, atraumatic. The mucous membranes are moist. The superficial temporal arteries are without ropiness or tenderness. Cardiovascular: Regular rate and rhythm. Lungs: Clear to auscultation bilaterally. Neck/Heme: There are no carotid bruits noted bilaterally.  NEUROLOGICAL: Orientation:  The patient is alert and oriented x 3.  Fund of knowledge is appropriate.  Recent and remote memory intact.  Attention span and concentration normal.  Repeats and names without difficulty. Cranial nerves: There is good facial symmetry. The pupils are equal round and reactive to light bilaterally. Fundoscopic exam reveals clear disc margins bilaterally. Extraocular muscles are intact and visual fields are full to confrontational testing. Speech is fluent and clear. Soft palate rises symmetrically and there is no tongue deviation. Hearing is intact to conversational tone. Tone: Tone is good throughout. Sensation: Sensation is intact to light touch and pinprick throughout (facial, trunk, extremities). Vibration is intact at the right big toe, but she reports she cannot feel it on the left big toe but it is intact at the knee.  There is no vibrational splitting.  There is no extinction with double simultaneous stimulation. There is no sensory dermatomal level identified. Coordination:  The patient has no difficulty with RAM's or FNF bilaterally. Motor: Strength is 5/5 in the right upper and lower extremities.  Strength is 5/5 in the left deltoid, biceps and triceps.  She reports she is unable to open the left hand at all.  It is held in a fist tightly.  With passive motion, I am able to open  the fingers, and then with distraction they stay open and then quickly closed.  With there is no breakdown of the skin in the hand.  There is no fingernail impressions on the hand.  She reports that she cannot move the toes or ankle on the left. DTR's: Deep tendon reflexes are 2-2+/4 at the bilateral biceps, triceps, brachioradialis, patella and achilles.  Plantar responses are downgoing bilaterally. Gait and Station: The patient walks slowly down the hall.  She has trouble running down the hall. Abnormal movements:  Tremor of the head is distractible.  It is in the "yes" direction.  When asked to tap out a beat with her hands, tremor in the head will match that beat.   IMPRESSION/PLAN  1.  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  I still think that symptoms are psychogenic.  They are distractible in my office and were in the emergency room as well.  She has been seeking therapy with a Veterinary surgeoncounselor.  Discussed with the patient that there is some data to suggest that physical therapy can be very helpful, even in patients with psychogenic dystonia.     Cc:  Jacquelin HawkingMcElroy, Shannon, [name]

## 2017-09-25 ENCOUNTER — Ambulatory Visit: Payer: Self-pay | Admitting: Neurology

## 2017-10-01 ENCOUNTER — Telehealth: Payer: Self-pay | Admitting: Neurology

## 2017-10-01 NOTE — Telephone Encounter (Signed)
Spoke with patient. She is trying to get approval for medicaid to cover some medical bills for her. She took paperwork to Dr. Arlyce DiceKaplan, but there is a section they would like Dr. Don Perkingat's input as it has to do with movements. I told the patient to bring paperwork to us and we would look at it to see if it would be something we could complete. She was very tearful, struggling because she can't see some of her doctors because she owes money. [facility] and states that is helping.

## 2017-10-01 NOTE — Telephone Encounter (Signed)
Patient called and is needing to speak with you to find out who to get a letter stating what she can and cannot do? Please Call. Thanks

## 2017-10-07 ENCOUNTER — Telehealth: Payer: Self-pay | Admitting: Neurology

## 2017-10-07 NOTE — Telephone Encounter (Signed)
Patient called wanting to check the status of her Medicaid paperwork that her Primary Doctor Dr.Caplan from [place] faxed over to be filled out. Please call her back at [phone].

## 2017-10-07 NOTE — Telephone Encounter (Signed)
Patient made aware we haven't received any paperwork.

## 2017-10-08 NOTE — Telephone Encounter (Signed)
Received form and it is not for medicaid - it is a work form. Patient will need functional capacity evaluation. I made patient aware and she will discuss with her PCP.

## 2017-10-24 NOTE — Progress Notes (Signed)
[name] was seen today in neurologic consultation at the request of [name], [name].  Her primary care provider is System, Pcp Not In.  The consultation is for the evaluation of "tremor."  Pt previously saw [name], [name] and those notes are reviewed.  Pt has not been seen by Dr. [name] for a year (06/2016).    Pt reports head and neck tremor for 13 or 14 years per records but pt states that it started acutely in 2017.  She was apparently seen by Dr. [name] at [facility] neurology first and then transferred her care to [place] indicate had several neurologists prior to Dr. [name].  She did see Dr. [name].  Records from [facility] indicate that the symptoms started in her neck and spread to her entire body and that pain was the biggest complaint.  Pain is described as over the entire body.  States that she cannot differentiate one pain from another as it is all over.  Pain is burning.  records indicate her father has tremor (she reports PD to me), but she does not have a relationship with him.  Her father's sisters also have tremor as do many cousins.  DYT6, DYT1 and RDP dx were entertained.  Had evidence of dystonia in the left hand and left foot on examination.  Patient was placed on trihexyphenidyl.  States that she tried it 2 different times and she had nausea and sweating.   She was enrolled in a trial for RDP (rapid onset dystonia with parkinsonism).    She states that she didn't end up doing that.  States that it was too hard for her to get back and forth.  she is currently on klonopin and has been on that medication for many years.  She doesn't think it helps but takes it primarily for anxiety and panic attacks.  She did get started on celexa for this 2 weeks ago but states that is causing nausea as well.  botox has been recommended over the years but pt unable to afford.  She tried baclofen with Dr. [name] but thought that it caused nausea.  fam hx: sister has  tremor with unknown dx.  Father and paternal GF with PD.  3 of fathers sisters with PD and 1 of cousins with PD.  Neuroimaging has  previously been performed.  It is available for my review today.  I reviewed her MRI brain from 02/2015 and it was normal.   09/25/17 update: Patient is seen here today in follow-up for dystonia, likely psychogenic.  Records have been reviewed since her last visit.  I did give her a very small dose of levodopa last visit to see if that would be of any benefit.  She states that she was very nauseated and couldn't take it even once per day.  She was in the emergency room on September 09, 2017 for the same symptoms that she presented to me with.  Notes from the ED physician indicate that she initially was not able to move her left hand out of a fist, but on reevaluation when distracted, hands/fingers were almost completely able to be straightened passively.  She states today that her arm is burning.  She reports that she has been working with a Transport planner.  She is going to therapy going weekly [employer]).  States that she has had some dizziness but told by PCP it was coming from her head shaking.  PREVIOUS MEDICATIONS: artane, klonopin, baclofen  ALLERGIES:  Allergies  Allergen Reactions  . Penicillins Anaphylaxis  . Contrast Media [Iodinated Diagnostic Agents] Other (See Comments)    unknown  . Sulfa Antibiotics Rash    CURRENT MEDICATIONS:  Outpatient Encounter Medications as of 10/28/2017  Medication Sig  . citalopram (CELEXA) 20 MG tablet Take 20 mg by mouth daily.  . clonazePAM (KLONOPIN) 0.5 MG tablet Take 0.5 mg by mouth 2 (two) times daily as needed.   . [DISCONTINUED] carbidopa-levodopa (SINEMET IR) 25-100 MG tablet Take 1 tablet by mouth daily.   No facility-administered encounter medications on file as of 10/28/2017.     PAST MEDICAL HISTORY:   Past Medical History:  Diagnosis Date  . Anxiety   . Depression   . Movement disorder   . Other  specified cardiac arrhythmias 1990s  . Panic attacks   . Polyneuritis   . PTSD (post-traumatic stress disorder)   . Tremors of nervous system     PAST SURGICAL HISTORY:   Past Surgical History:  Procedure Laterality Date  . ABDOMINAL HYSTERECTOMY  late 1990s  . ADENOIDECTOMY    . FOOT FRACTURE SURGERY Right   . OVARIAN CYST REMOVAL      SOCIAL HISTORY:   Social History   Socioeconomic History  . Marital status: Divorced    Spouse name: Not on file  . Number of children: 1  . Years of education: Not on file  . Highest education level: 12th grade  Occupational History  . Not on file  Social Needs  . Financial resource strain: Not on file  . Food insecurity:    Worry: Not on file    Inability: Not on file  . Transportation needs:    Medical: Not on file    Non-medical: Not on file  Tobacco Use  . Smoking status: Never Smoker  . Smokeless tobacco: Never Used  Substance and Sexual Activity  . 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 Drug use: Yes    Types: Cocaine, Marijuana    Comment: no cocain since the 80's. occ marijuana (1 /mo)  . Sexual activity: Not Currently  Lifestyle  . Physical activity:    Days per week: Not on file    Minutes per session: Not on file  . Stress: Not on file  Relationships  . Social connections:    Talks on phone: Not on file    Gets together: Not on file    Attends religious service: Not on file    Active member of club or organization: Not on file    Attends meetings of clubs or organizations: Not on file    Relationship status: Not on file  . Intimate partner violence:    Fear of current or ex partner: Not on file    Emotionally abused: Not on file    Physically abused: Not on file    Forced sexual activity: Not on file  Other Topics Concern  . Not on file  Social History Narrative   Patient is divorced, lives with her mother. She only occasionally drinks caffeine. Exercise is limited.     FAMILY HISTORY:   Family Status  Relation Name Status  . Father  Deceased  . Mother  Alive  . Mat Aunt  (Not Specified)  . Mat Uncle  (Not Specified)  . MGM  Deceased  . MGF  Deceased  . PGM  Deceased  . PGF  Deceased  . Mat Uncle  (Not Specified)  .  Sister  Alive  . Sister  Alive  . Pat Aunt  Alive  . Pat Aunt  Alive  . Pat Aunt  Alive  . Pat Uncle  Alive  . Son  Alive    ROS:  ROS  PHYSICAL EXAMINATION:    VITALS:   Vitals:   10/28/17 1054  BP: 140/80  Pulse: 82  SpO2: 98%  Weight: 123 lb (55.8 kg)  Height: 5' 6" (1.676 m)    GEN:  Normal appears female in no acute distress.  Appears stated age.  Tearful at times HEENT:  Normocephalic, atraumatic. The mucous membranes are moist. The superficial temporal arteries are without ropiness or tenderness. Cardiovascular: Regular rate and rhythm. Lungs: Clear to auscultation bilaterally. Neck/Heme: There are no carotid bruits noted bilaterally.  NEUROLOGICAL: Orientation:  The patient is alert and oriented x 3.  Fund of knowledge is appropriate.  Recent and remote memory intact.  Attention span and concentration normal.  Repeats and names without difficulty. Cranial nerves: There is good facial symmetry. The pupils are equal round and reactive to light bilaterally. Fundoscopic exam reveals clear disc margins bilaterally. Extraocular muscles are intact and visual fields are full to confrontational testing. Speech is fluent and clear. Soft palate rises symmetrically and there is no tongue deviation. Hearing is intact to conversational tone. Tone: Tone is good throughout. Sensation: Sensation is intact to light touch and pinprick throughout (facial, trunk, extremities).  There is vibrational splitting across the chest. Coordination:  The patient has no difficulty with RAM's or FNF bilaterally. Motor: Strength is 5/5 in the right upper and lower extremities.  Strength is 5/5 in the left deltoid, biceps and triceps.  She  reports she is unable to open the left hand at all.  It is held in a fist tightly.  With passive motion, I am able to open the fingers, and then with distraction they stay open and then quickly closed.  With there is no breakdown of the skin in the hand.  There is no fingernail impressions on the hand (this is the same as last visit)..  She reports that she cannot move the toes or ankle on the left, but is able to flex the ankle with ambulation. Gait and Station: The patient walks well in the hall today. Abnormal movements: There is no tremor "yes" direction.  It is distractible.  When asked to tap out a beat with her feet, the head tremor will match that.     IMPRESSION/PLAN  1.  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  I still think that symptoms are psychogenic.  They are distractible in my office and were in the emergency room as well.  She has been seeking therapy with a counselor.  Discussed with the patient that there is some data to suggest that physical therapy can be very helpful, even in patients with psychogenic dystonia.    -I would like to start occupational therapy, but the patient currently has no insurance.  She was given information on [facility] financial assistance and was told to fill out the form  -I did give her a prescription for Zanaflex given that she is having some pain and tightness in the left arm.  She is not to drive while taking it, but states that she generally is not driving right now.  -She met with my social worker today.  -She was given the good Rx card  -CT brain in 09/2017 was unremarkable   for focal lesion  2.  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  I reassured her that she does not have Alzheimer's disease.  3.  Nausea  -told her to f/u with PCP about that.    4.  Much greater than 50% of this visit was spent in counseling and coordinating care.  Total face to face time:  25  min  Cc:  System, Pcp Not In

## 2017-10-28 ENCOUNTER — Encounter: Payer: Self-pay | Admitting: Neurology

## 2017-10-28 ENCOUNTER — Encounter

## 2017-10-28 ENCOUNTER — Ambulatory Visit (INDEPENDENT_AMBULATORY_CARE_PROVIDER_SITE_OTHER): Payer: Self-pay | Admitting: Neurology

## 2017-10-28 VITALS — BP 140/80 | HR 82 | Ht 66.0 in | Wt 123.0 lb

## 2017-10-28 DIAGNOSIS — R110 Nausea: Secondary | ICD-10-CM

## 2017-10-28 DIAGNOSIS — F444 Conversion disorder with motor symptom or deficit: Secondary | ICD-10-CM

## 2017-10-28 MED ORDER — TIZANIDINE HCL 2 MG PO CAPS: 2.0000 mg | ORAL_CAPSULE | Freq: Two times a day (BID) | ORAL | 1 refills | Status: DC | PRN

## 2017-11-04 ENCOUNTER — Other Ambulatory Visit (HOSPITAL_COMMUNITY): Payer: Self-pay | Admitting: *Deleted

## 2017-11-04 DIAGNOSIS — R229 Localized swelling, mass and lump, unspecified: Principal | ICD-10-CM

## 2017-11-04 DIAGNOSIS — IMO0002 Reserved for concepts with insufficient information to code with codable children: Secondary | ICD-10-CM

## 2017-11-18 ENCOUNTER — Ambulatory Visit (HOSPITAL_COMMUNITY)
Admission: RE | Admit: 2017-11-18 | Discharge: 2017-11-18 | Disposition: A | Discharge status: Home / Self Care | Payer: PRIVATE HEALTH INSURANCE | Source: Ambulatory Visit | Attending: *Deleted | Admitting: *Deleted

## 2017-11-18 ENCOUNTER — Ambulatory Visit (HOSPITAL_COMMUNITY): Payer: PRIVATE HEALTH INSURANCE

## 2017-11-18 DIAGNOSIS — IMO0002 Reserved for concepts with insufficient information to code with codable children: Secondary | ICD-10-CM

## 2017-11-18 DIAGNOSIS — R229 Localized swelling, mass and lump, unspecified: Principal | ICD-10-CM

## 2018-01-19 ENCOUNTER — Ambulatory Visit: Payer: Self-pay | Admitting: Neurology

## 2018-04-27 ENCOUNTER — Emergency Department (HOSPITAL_COMMUNITY)
Admission: AD | Admit: 2018-04-27 | Discharge: 2018-04-27 | Disposition: A | Discharge status: Home / Self Care | Payer: Self-pay | Attending: Emergency Medicine | Admitting: Emergency Medicine

## 2018-04-27 ENCOUNTER — Encounter (HOSPITAL_COMMUNITY): Payer: Self-pay | Admitting: Emergency Medicine

## 2018-04-27 ENCOUNTER — Other Ambulatory Visit: Payer: Self-pay

## 2018-04-27 ENCOUNTER — Emergency Department (HOSPITAL_COMMUNITY): Payer: Self-pay

## 2018-04-27 DIAGNOSIS — R1030 Lower abdominal pain, unspecified: Secondary | ICD-10-CM | POA: Insufficient documentation

## 2018-04-27 DIAGNOSIS — Z8742 Personal history of other diseases of the female genital tract: Secondary | ICD-10-CM

## 2018-04-27 DIAGNOSIS — Z79899 Other long term (current) drug therapy: Secondary | ICD-10-CM | POA: Insufficient documentation

## 2018-04-27 DIAGNOSIS — R319 Hematuria, unspecified: Secondary | ICD-10-CM | POA: Insufficient documentation

## 2018-04-27 LAB — CBC
HCT: 43.6 % (ref 36.0–46.0)
Hemoglobin: 14.9 g/dL (ref 12.0–15.0)
MCH: 32.1 pg (ref 26.0–34.0)
MCHC: 34.2 g/dL (ref 30.0–36.0)
MCV: 94 fL (ref 80.0–100.0)
Platelets: 209 10*3/uL (ref 150–400)
RBC: 4.64 MIL/uL (ref 3.87–5.11)
RDW: 12.1 % (ref 11.5–15.5)
WBC: 7 10*3/uL (ref 4.0–10.5)
nRBC: 0 % (ref 0.0–0.2)

## 2018-04-27 LAB — URINALYSIS, ROUTINE W REFLEX MICROSCOPIC
Bilirubin Urine: NEGATIVE
Glucose, UA: NEGATIVE mg/dL
Ketones, ur: 5 mg/dL — AB
Leukocytes,Ua: NEGATIVE
Nitrite: NEGATIVE
Protein, ur: NEGATIVE mg/dL
Specific Gravity, Urine: 1.01 (ref 1.005–1.030)
pH: 6 (ref 5.0–8.0)

## 2018-04-27 LAB — COMPREHENSIVE METABOLIC PANEL
ALT: 20 U/L (ref 0–44)
AST: 25 U/L (ref 15–41)
Albumin: 5 g/dL (ref 3.5–5.0)
Alkaline Phosphatase: 82 U/L (ref 38–126)
Anion gap: 11 (ref 5–15)
BUN: 12 mg/dL (ref 6–20)
CHLORIDE: 105 mmol/L (ref 98–111)
CO2: 24 mmol/L (ref 22–32)
Calcium: 9.9 mg/dL (ref 8.9–10.3)
Creatinine, Ser: 0.86 mg/dL (ref 0.44–1.00)
GFR calc Af Amer: >60 mL/min (ref >60)
Glucose, Bld: 111 mg/dL — ABNORMAL HIGH (ref 70–99)
Potassium: 3.7 mmol/L (ref 3.5–5.1)
Sodium: 140 mmol/L (ref 135–145)
Total Bilirubin: 1.2 mg/dL (ref 0.3–1.2)
Total Protein: 7.9 g/dL (ref 6.5–8.1)

## 2018-04-27 NOTE — ED Notes (Signed)
Pt back from CT

## 2018-04-27 NOTE — MAU Provider Note (Signed)
First Provider Initiated Contact with Patient 04/27/18 1251      S Ms. [name] is a 53 y.o.  non-pregnant female who presents to MAU today with complaint of vaginal bleeding. Bleeding started one week ago. She is changing pads q2h. Reports history of partial hysterectomy. Was seen by [facility] provider on Friday, March 20 and told if she continued to bleed she would need to come to MAU. She went by office this morning and was instructed to come here.   Reports associated vaginal pressure/pain with the bleeding. She endorses some general feelings of fatigue and weakness. Denies feeling like she will pass out or actual syncope. Denies SOB, chest pain, sensation of heart racing.   Is very anxious. Says she was recently diagnosed with MS and Parkinson's and she is very scared.   O BP (!) 143/80 (BP Location: Right Arm)   Pulse 90   Temp 98.8 F (37.1 C) (Oral)   Resp 18   Wt 56 kg   SpO2 100%   BMI 19.93 kg/m  Physical Exam  Constitutional: She is oriented to person, place, and time. She appears well-developed and well-nourished.  HENT:  Head: Normocephalic and atraumatic.  Eyes: Conjunctivae and EOM are normal.  Neck: Normal range of motion. Neck supple.  Cardiovascular: Normal rate, regular rhythm and normal heart sounds.  Respiratory: Effort normal and breath sounds normal. No respiratory distress.  GI: Soft. She exhibits no distension. There is no abdominal tenderness. There is no rebound and no guarding.  Neurological: She is alert and oriented to person, place, and time.  Resting tremor noted.   Psychiatric:  Very anxious.     A Non pregnant female Medical screening exam complete Vaginal bleeding in post-menopausal patient with h/o hysterectomy   P Discussed with patient that we do not see non-OB patients in MAU. Apologized for confusion.  Will transport patient to [facility] by wheelchair for further examination and evaluation.  Patient hemodynamically stable for  transport.    Arvilla Market, DO 04/27/2018 12:54 PM

## 2018-04-27 NOTE — ED Triage Notes (Signed)
Pt here from home with c/o vaginal bleeding times 1 week , states that the bleeding slowed down sat but then started back

## 2018-04-27 NOTE — ED Notes (Signed)
Patient transported to CT 

## 2018-04-27 NOTE — Discharge Instructions (Addendum)
Work-up here CT scan abdomen labs without any specific abnormality.  No explanation for the abdominal discomfort or distention on the CT scan.  Urine does have some blood in it but not directly consistent with a urinary tract infection.  But sent for urine culture you will be notified if it is consistent with urinary tract infection.  There was microscopic blood in the urine.  Recommend following back up with either your primary care doctor or OB/GYN doctor.

## 2018-04-27 NOTE — ED Notes (Signed)
Urine Culture sent to Main Lab with UA 

## 2018-04-27 NOTE — ED Provider Notes (Signed)
[facility] EMERGENCY DEPARTMENT Provider Note   CSN: [id number] Arrival date & time: 04/27/18  1225    History   Chief Complaint Chief Complaint  Patient presents with  . Abdominal Pain  . Vaginal Bleeding    HPI [name] is a 53 y.o. female.     Patient with a complaint of abdominal distention vaginal bleeding for approximately a week intermittently on and off.  Patient has had a partial hysterectomy.  Patient was seen by by OB/GYN on Friday [facility] OB/GYN patient stated a full pelvic.  They were unable to find a cause for the bleeding.  Patient states she still seeing blood.  Denies fevers nausea vomiting or diarrhea.  No blood in her stools.     Past Medical History:  Diagnosis Date  . Anxiety   . Depression   . Movement disorder   . Other specified cardiac arrhythmias 1990s  . Panic attacks   . Polyneuritis   . PTSD (post-traumatic stress disorder)   . Tremors of nervous system     Patient Active Problem List   Diagnosis Date Noted  . Tremor 05/05/2015  . Dystonia 05/05/2015  . Breast mass 05/05/2015  . Dysuria 05/05/2015    Past Surgical History:  Procedure Laterality Date  . ABDOMINAL HYSTERECTOMY  late 1990s  . ADENOIDECTOMY    . FOOT FRACTURE SURGERY Right   . OVARIAN CYST REMOVAL       OB History   No obstetric history on file.      Home Medications    Prior to Admission medications   Medication Sig Start Date End Date Taking? Authorizing Provider  citalopram (CELEXA) 20 MG tablet Take 40 mg by mouth daily.  07/14/17   [provider]  clonazePAM (KLONOPIN) 0.5 MG tablet Take 0.5 mg by mouth 2 (two) times daily as needed.     [provider]  tizanidine (ZANAFLEX) 2 MG capsule Take 1 capsule (2 mg total) by mouth 2 (two) times daily as needed for muscle spasms. 10/28/17   TatOctaviano Batty, DO    Family History Family History  Problem Relation Age of Onset  . Parkinson's disease Father   .  Stroke Father   . Diabetes Mother   . Hypertension Mother   . Heart attack Maternal Aunt   . Heart attack Maternal Uncle   . Cancer Maternal Grandmother        ovarian cancer  . Heart attack Maternal Grandmother   . Cerebrovascular Accident Maternal Grandmother   . Heart attack Maternal Grandfather   . Cerebrovascular Accident Maternal Grandfather   . Stroke Paternal Grandmother   . Diabetes Paternal Grandmother   . Congestive Heart Failure Paternal Grandmother   . Stroke Paternal Grandfather   . Parkinson's disease Paternal Grandfather   . Heart attack Maternal Uncle   . Movement disorder Sister   . Parkinson's disease Paternal Aunt        3 daughters also have Parkinsons  . Parkinson's disease Paternal Aunt   . Parkinson's disease Paternal Aunt   . Parkinson's disease Paternal Uncle     Social History Social History   Tobacco Use  . Smoking status: Never Smoker  . Smokeless tobacco: Never Used  Substance Use Topics  . 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 Drug use: Yes    Types: Cocaine, Marijuana    Comment: no cocain since the 80's. occ marijuana (1 /mo)  Allergies   Penicillins; Contrast media [iodinated diagnostic agents]; and Sulfa antibiotics   Review of Systems Review of Systems  Constitutional: Negative for chills and fever.  HENT: Negative for rhinorrhea and sore throat.   Eyes: Negative for visual disturbance.  Respiratory: Negative for cough and shortness of breath.   Cardiovascular: Negative for chest pain and leg swelling.  Gastrointestinal: Negative for abdominal pain, diarrhea, nausea and vomiting.  Genitourinary: Positive for vaginal bleeding. Negative for dysuria.  Musculoskeletal: Negative for back pain and neck pain.  Skin: Negative for rash.  Neurological: Negative for dizziness, light-headedness and headaches.  Hematological: Does not bruise/bleed easily.  Psychiatric/Behavioral: Negative for  confusion.     Physical Exam Updated Vital Signs BP 115/76   Pulse 65   Temp 98.5 F (36.9 C) (Oral)   Resp 18   Wt 56 kg   SpO2 98%   BMI 19.93 kg/m   Physical Exam Vitals signs and nursing note reviewed.  Constitutional:      General: She is not in acute distress.    Appearance: She is well-developed.  HENT:     Head: Normocephalic and atraumatic.     Mouth/Throat:     Mouth: Mucous membranes are moist.  Eyes:     Extraocular Movements: Extraocular movements intact.     Conjunctiva/sclera: Conjunctivae normal.     Pupils: Pupils are equal, round, and reactive to light.  Neck:     Musculoskeletal: Normal range of motion and neck supple.  Cardiovascular:     Rate and Rhythm: Normal rate and regular rhythm.     Heart sounds: Normal heart sounds. No murmur.  Pulmonary:     Effort: Pulmonary effort is normal. No respiratory distress.     Breath sounds: Normal breath sounds.  Abdominal:     Palpations: Abdomen is soft.     Tenderness: There is no abdominal tenderness.  Genitourinary:    Vagina: No vaginal discharge.     Comments: Bimanual pelvic exam speculum exam not done good just done on Friday by OB/GYN.  No blood in the vaginal vault no cervix no ovarian tenderness.  There is some lower abdominal distention.  External genitalia normal. Musculoskeletal: Normal range of motion.  Skin:    General: Skin is warm and dry.     Capillary Refill: Capillary refill takes less than 2 seconds.  Neurological:     Mental Status: She is alert and oriented to person, place, and time.      ED Treatments / Results  Labs (all labs ordered are listed, but only abnormal results are displayed) Labs Reviewed  COMPREHENSIVE METABOLIC PANEL - Abnormal; Notable for the following components:      Result Value   Glucose, Bld 111 (*)    All other components within normal limits  URINALYSIS, ROUTINE W REFLEX MICROSCOPIC - Abnormal; Notable for the following components:   Hgb urine  dipstick LARGE (*)    Ketones, ur 5 (*)    Bacteria, UA RARE (*)    All other components within normal limits  URINE CULTURE  CBC    EKG None  Radiology Ct Abdomen Pelvis Wo Contrast  Result Date: 04/27/2018 CLINICAL DATA:  Vaginal bleeding, lower abdominal pain EXAM: CT ABDOMEN AND PELVIS WITHOUT CONTRAST TECHNIQUE: Multidetector CT imaging of the abdomen and pelvis was performed following the standard protocol without IV contrast. COMPARISON:  04/18/2015 FINDINGS: Lower chest: No acute abnormality. Hepatobiliary: No focal liver abnormality is seen. No gallstones, gallbladder wall thickening, or biliary dilatation.  Pancreas: Unremarkable. No pancreatic ductal dilatation or surrounding inflammatory changes. Spleen: Normal in size without focal abnormality. Adrenals/Urinary Tract: Adrenal glands are unremarkable. Kidneys are normal, without renal calculi, focal lesion, or hydronephrosis. Bladder is unremarkable. Stomach/Bowel: Stomach is within normal limits. Appendix appears normal. No evidence of bowel wall thickening, distention, or inflammatory changes. Vascular/Lymphatic: No significant vascular findings are present. No enlarged abdominal or pelvic lymph nodes. Reproductive: No mass or other abnormality. Postoperative findings of partial hysterectomy. Other: No abdominal wall hernia or abnormality. No abdominopelvic ascites. Musculoskeletal: No acute or significant osseous findings. IMPRESSION: No non-contrast CT findings of the abdomen or pelvis to explain vaginal bleeding. There are postoperative findings of partial hysterectomy. Electronically Signed   By: Lauralyn Primes M.D.   On: 04/27/2018 14:38    Procedures Procedures (including critical care time)  Medications Ordered in ED Medications - No data to display   Initial Impression / Assessment and Plan / ED Course  I have reviewed the triage vital signs and the nursing notes.  Pertinent labs & imaging results that were available  during my care of the patient were reviewed by me and considered in my medical decision making (see chart for details).       Work-up here CT abdomen no explanation for the lower quadrant abdominal tenderness without guarding no explanation for the distention.  Bimanual vaginal exam showed no evidence of any vaginal blood.  Urinalysis had a little bit of hematuria but this would be more microscopic culture sent to confirm whether it may be evidence of urinary tract infection.  Otherwise patient needs to follow back up with OB/GYN if hematuria persists probably needs referral to urology.  Patient nontoxic no acute distress.   Final Clinical Impressions(s) / ED Diagnoses   Final diagnoses:  Hematuria, unspecified type  Hx of vaginal bleeding  Lower abdominal pain    ED Discharge Orders    None       Vanetta Mulders, MD 04/27/18 1549

## 2018-04-27 NOTE — MAU Note (Signed)
Pt is bleeding.  Went to Dr Mindi Slicker on Friday.  Had started bleeding on Tues.  Was told if bleeding continued or got worse, to come to the [facility] ER.  Had a partial hysterectomy 20 yrs ago, has not been having bleeding.

## 2018-04-28 LAB — URINE CULTURE: Culture: NO GROWTH

## 2018-07-20 NOTE — Progress Notes (Signed)
Virtual Visit via Video Note The purpose of this virtual visit is to provide medical care while limiting exposure to the novel coronavirus.    Consent was obtained for video visit:  Yes.   Answered questions that patient had about telehealth interaction:  Yes.   I discussed the limitations, risks, security and privacy concerns of performing an evaluation and management service by telemedicine. I also discussed with the patient that there may be a patient responsible charge related to this service. The patient expressed understanding and agreed to proceed.  Pt location: Home Physician Location: home Name of referring provider:  Richmond CampbellKaplan, Kristen W., [name] I connected with [name] at patients initiation/request on 07/22/2018 at 10:45 AM EDT by video enabled telemedicine application and verified that I am speaking with the correct person using two identifiers. Pt MRN:  [id number] Pt DOB:  04/03/1965 Video Participants:  [name];     History of Present Illness:  Patient seen today in follow-up.  Have not seen her since September of last year.  She has a history of psychogenic tremor and psychogenic dystonia.  Discussed last visit that occupational therapy can be of significant value, but she had no insurance.  I gave her information on [facility] financial assistance and she was told to fill out the form to see if she would be eligible for that.  She reports today that she is working through that process and is awaiting.  She thinks that she will hear from them in 10 days.  She was also given some Zanaflex because of the pain and tightness of the left arm.  She states that she took it for 3 weeks and "I ran out of my RX."  She had refills but "they say that I didn't."  She c/o "her entire left side of the body feels like it is on fire. "  States that her left arm is painful, as is the left foot.   Most importantly, I told the patient last visit she needed to continue with her counselor.   She states she is doing it on the phone.  She complains of continued head tremor.  Personally reviewed her MRI of the cervical spine images from 2017.  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  The MRI of the brain from 2017 was unremarkable.  These were done because of the same symptoms she complains about today.   Current Outpatient Medications on File Prior to Visit  Medication Sig Dispense Refill  . clonazePAM (KLONOPIN) 0.5 MG tablet Take 0.5 mg by mouth 2 (two) times daily as needed.      No current facility-administered medications on file prior to visit.      Observations/Objective:   Vitals:   07/22/18 0930  Weight: 115 lb (52.2 kg)  Height: 5\' 6"  (1.676 m)   GEN:  The patient appears stated age and is in NAD.  Neurological examination:  Orientation: The patient is alert and oriented x3. Cranial nerves: There is good facial symmetry. There is no facial hypomimia.  The speech is fluent and clear. Soft palate rises symmetrically and there is no tongue deviation. Hearing is intact to conversational tone. Motor: Strength is at least antigravity x 4.   Shoulder shrug is equal and symmetric.  There is no pronator drift.  Movement examination: Tone: unable Abnormal movements: She has intermittent bobbing of the head in the "yes" direction.  It is distractible. Coordination:  There is no trouble with rapid alternating movements on  the right.  On the left, she initially states that she is unable to unfist the left hand.  However, after encouragement and with time, she was slowly able to get the fingers open (and told her she could not use the other hand to do it).  Finger taps on that side are slow (and patient reports painful). Gait and Station: The patient walks well with intermittent bobbing of the head and the right arm is intermittently flexed.    Assessment and Plan:   1.  Psychogenic tremor with psychogenic dystonia  -Nature and pathophysiology discussed.  She is seeking  treatment with a counselor.  Discussed that she and the counselor needed to talk not only about her emotional trauma, but healthy emotional trauma creates her physical symptoms/somatization.  She becomes very emotional and states that "it is hard."  Told her that I was very proud of her for continuing with counseling for this long and that treating psychogenic tremor and psychogenic dystonia is generally a very long road.  -Patient reports that she thinks she is going to be approved for, financial assist.  Hopefully, that will mean she can attend therapy as she is currently self-pay.  It has been well reported that occupational therapy can be very helpful for psychogenic dystonia.  She is reporting more pain because of this.  She thinks she will have an answer in 10 days.  She is to call me back and let me know, and we can refer her to 1 of the [facility] facilities if she is approved.  -Reports she is having very severe burning.  Decided to go ahead and add low-dose gabapentin, 100 mg 3 times per day.  Risks, benefits, side effects and alternative therapies were discussed.  The opportunity to ask questions was given and they were answered to the best of my ability.  The patient expressed understanding and willingness to follow the outlined treatment protocols.  -asked the patient to buy crazy Aarons silly putty and work with it in the L hand as OT for the hand.  Follow Up Instructions:  9 months  -I discussed the assessment and treatment plan with the patient. The patient was provided an opportunity to ask questions and all were answered. The patient agreed with the plan and demonstrated an understanding of the instructions.   The patient was advised to call back or seek an in-person evaluation if the symptoms worsen or if the condition fails to improve as anticipated.    Total Time spent in visit with the patient was:  25 min, of which more than 50% of the time was spent in counseling, as above.  Pt  understands and agrees with the plan of care outlined.     Alonza Bogus, DO

## 2018-07-22 ENCOUNTER — Other Ambulatory Visit: Payer: Self-pay

## 2018-07-22 ENCOUNTER — Encounter: Payer: Self-pay | Admitting: Neurology

## 2018-07-22 ENCOUNTER — Telehealth (INDEPENDENT_AMBULATORY_CARE_PROVIDER_SITE_OTHER): Payer: Self-pay | Admitting: Neurology

## 2018-07-22 DIAGNOSIS — F444 Conversion disorder with motor symptom or deficit: Secondary | ICD-10-CM

## 2018-07-22 MED ORDER — GABAPENTIN 100 MG PO CAPS: 100.0000 mg | ORAL_CAPSULE | Freq: Three times a day (TID) | ORAL | 1 refills | Status: DC

## 2019-04-19 NOTE — Progress Notes (Signed)
Virtual Visit via audio note (attempted video but patient was not able to get on platform) The purpose of this virtual visit is to provide medical care while limiting exposure to the novel coronavirus.    Consent was obtained for telephone visit:  Yes.   Answered questions that patient had about telehealth interaction:  Yes.   I discussed the limitations, risks, security and privacy concerns of performing an evaluation and management service by telephone. I also discussed with the patient that there may be a patient responsible charge related to this service. The patient expressed understanding and agreed to proceed.  Pt location: Home Physician Location: home Name of referring provider:  Aletha Halim., [name] I connected with Marta Lamas at patients initiation/request on 04/21/2019 at  9:45 AM EDT by telephone and verified that I am speaking with the correct person using two identifiers. Pt MRN:  [id number] Pt DOB:  April 05, 1965 Video Participants:  Marta Lamas;     History of Present Illness:  Patient seen today in follow-up for tremor and dystonia, both functional in nature.  Last visit, she was having issues with financial assistance, as she was currently self-pay and was not able to afford occupational therapy.  She was having more pain and issues because of that.  I do not see in the records that she was ever able to get to occupational therapy, but I do see that she has Medicaid.  She states that this is only Medicaid if she had a baby and not true Medicaid.  She states that she was not able ever to see if she will qualify for [facility] Financial assistance.  I had asked her to buy some crazy Aarons silly putty as an OT device and work with it on her L hand.  We started her on gabapentin, 100 mg 3 times per day.  States that this didn't help.  States today that "body burns all over from the bottom of my feet to the top of my head."  She went to chiropractic tx and her mom paid $1400  for that.  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  She has not been to primary care.  She has not been back to counseling, although she liked it when she went.  States that they closed down at the beginning of Covid a year ago when told her they would call her back.  She has not followed up since and they have not called her.  Current movement d/o meds: Gabapentin, 100 mg 3 times per day (no longer on it as has run out)    Current Outpatient Medications on File Prior to Visit  Medication Sig Dispense Refill  . clonazePAM (KLONOPIN) 0.5 MG tablet Take 0.5 mg by mouth 2 (two) times daily as needed.     . gabapentin (NEURONTIN) 100 MG capsule Take 1 capsule (100 mg total) by mouth 3 (three) times daily. 270 capsule 1   No current facility-administered medications on file prior to visit.     Observations/Objective:   Vitals:   04/21/19 0757  Weight: 112 lb (50.8 kg)  Height: 5\' 6"  (1.676 m)   Patient is alert and oriented x3.   Her speech is fluent and clear.  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  Understands that long-term psychotherapy is with treatment for this.  Occupational therapy can help  as well.  -Discussed with the patient that she needed to follow-up with [facility] financial assistance office and find out if she was approved or denied.  That way, we can find out if she would qualify for occupational therapy.    -Discussed that she needs a primary care physician.  She has multiple other complaints that are unrelated to this.  Gave her the number to the family medicine clinic.  Told her to call and make an appointment.  -Told her she needed to follow-up with the [facility] foundation.  She was on a grant for the counseling.  She stated that it closed down at the beginning of Covid.  Discussed that she needed to be her own advocate and make sure she was following up  and calling them.  -She asked me about increasing her gabapentin.  She did not find it helpful.  I told her I did not want to do that, because she is complaining about pain and burning paresthesias from her head to the tip of her toes.  Discussed with her that there is not a single nerve/neurologic condition that would cause this and I would really not know what I was treating.  Follow Up Instructions:  she is to call me once she has done the above and f/u decided after that.   -I discussed the assessment and treatment plan with the patient. The patient was provided an opportunity to ask questions and all were answered. The patient agreed with the plan and demonstrated an understanding of the instructions.   The patient was advised to call back or seek an in-person evaluation if the symptoms worsen or if the condition fails to improve as anticipated.    Total time spent on phone today was 15 min   Lurena Joiner Chazz Philson, DO

## 2019-04-20 ENCOUNTER — Telehealth: Payer: Self-pay | Admitting: Adult Health

## 2019-04-20 ENCOUNTER — Other Ambulatory Visit: Payer: Self-pay | Admitting: Adult Health

## 2019-04-20 DIAGNOSIS — U071 COVID-19: Secondary | ICD-10-CM

## 2019-04-20 NOTE — Progress Notes (Signed)
  I connected by phone with [name] on 04/20/2019 at 6:54 PM to discuss the potential use of an new treatment for mild to moderate COVID-19 viral infection in non-hospitalized patients.  This patient is a 54 y.o. female that meets the FDA criteria for Emergency Use Authorization of bamlanivimab or casirivimab\imdevimab.  Has a (+) direct SARS-CoV-2 viral test result  Has mild or moderate COVID-19   Is ? 53 years of age and weighs ? 40 kg  Is NOT hospitalized due to COVID-19  Is NOT requiring oxygen therapy or requiring an increase in baseline oxygen flow rate due to COVID-19  Is within 10 days of symptom onset  Has at least one of the high risk factor(s) for progression to severe COVID-19 and/or hospitalization as defined in EUA.  Specific high risk criteria : Immunosuppressive Disease   Symptom onset 3/13, tested positive on 3/16.  Symptoms include chills, headache, no taste or smell, fatigue and loose stool.     I have spoken and communicated the following to the patient or parent/caregiver:  1. FDA has authorized the emergency use of bamlanivimab and casirivimab\imdevimab for the treatment of mild to moderate COVID-19 in adults and pediatric patients with positive results of direct SARS-CoV-2 viral testing who are 43 years of age and older weighing at least 40 kg, and who are at high risk for progressing to severe COVID-19 and/or hospitalization.  2. The significant known and potential risks and benefits of bamlanivimab and casirivimab\imdevimab, and the extent to which such potential risks and benefits are unknown.  3. Information on available alternative treatments and the risks and benefits of those alternatives, including clinical trials.  4. Patients treated with bamlanivimab and casirivimab\imdevimab should continue to self-isolate and use infection control measures (e.g., wear mask, isolate, social distance, avoid sharing personal items, clean and disinfect "high  touch" surfaces, and frequent handwashing) according to CDC guidelines.   5. The patient or parent/caregiver has the option to accept or refuse bamlanivimab or casirivimab\imdevimab .  After reviewing this information with the patient, The patient agreed to proceed with receiving the bamlanimivab infusion and will be provided a copy of the Fact sheet prior to receiving the infusion.[name] 04/20/2019 6:54 PM

## 2019-04-20 NOTE — Telephone Encounter (Signed)
See patient orders only encounter

## 2019-04-21 ENCOUNTER — Ambulatory Visit (HOSPITAL_COMMUNITY)
Admission: RE | Admit: 2019-04-21 | Discharge: 2019-04-21 | Disposition: A | Discharge status: Home / Self Care | Payer: Medicaid Other | Source: Ambulatory Visit | Attending: Pulmonary Disease | Admitting: Pulmonary Disease

## 2019-04-21 ENCOUNTER — Encounter (HOSPITAL_COMMUNITY): Payer: Self-pay

## 2019-04-21 ENCOUNTER — Other Ambulatory Visit: Payer: Self-pay

## 2019-04-21 ENCOUNTER — Encounter: Payer: Self-pay | Admitting: Neurology

## 2019-04-21 ENCOUNTER — Telehealth (INDEPENDENT_AMBULATORY_CARE_PROVIDER_SITE_OTHER): Payer: Self-pay | Admitting: Neurology

## 2019-04-21 VITALS — Ht 66.0 in | Wt 112.0 lb

## 2019-04-21 DIAGNOSIS — U071 COVID-19: Secondary | ICD-10-CM | POA: Diagnosis not present

## 2019-04-21 DIAGNOSIS — F444 Conversion disorder with motor symptom or deficit: Secondary | ICD-10-CM

## 2019-04-21 MED ORDER — FAMOTIDINE IN NACL 20-0.9 MG/50ML-% IV SOLN: 20.0000 mg | Freq: Once | INTRAVENOUS | Status: DC | PRN

## 2019-04-21 MED ORDER — SODIUM CHLORIDE 0.9 % IV SOLN: INTRAVENOUS | Status: DC | PRN

## 2019-04-21 MED ORDER — DIPHENHYDRAMINE HCL 50 MG/ML IJ SOLN: 50.0000 mg | Freq: Once | INTRAMUSCULAR | Status: DC | PRN

## 2019-04-21 MED ORDER — EPINEPHRINE 0.3 MG/0.3ML IJ SOAJ: 0.3000 mg | Freq: Once | INTRAMUSCULAR | Status: DC | PRN

## 2019-04-21 MED ORDER — METHYLPREDNISOLONE SODIUM SUCC 125 MG IJ SOLR: 125.0000 mg | Freq: Once | INTRAMUSCULAR | Status: DC | PRN

## 2019-04-21 MED ORDER — ALBUTEROL SULFATE HFA 108 (90 BASE) MCG/ACT IN AERS: 2.0000 | INHALATION_SPRAY | Freq: Once | RESPIRATORY_TRACT | Status: DC | PRN

## 2019-04-21 MED ORDER — SODIUM CHLORIDE 0.9 % IV SOLN
700.0000 mg | Freq: Once | INTRAVENOUS | Status: AC
  Administered 2019-04-21: 700 mg via INTRAVENOUS
  Filled 2019-04-21: qty 700

## 2019-04-21 NOTE — Progress Notes (Signed)
  Diagnosis: COVID-19  Physician: Dr. Wright  Procedure: [facility] Med: bamlanivimab infusion - Provided patient with bamlanimivab fact sheet for patients, parents and caregivers prior to infusion.  Complications: No immediate complications noted.  Discharge: Discharged home   [name] [name] 04/21/2019  

## 2019-04-21 NOTE — Discharge Instructions (Signed)
10 Things You Can Do to Manage Your COVID-19 Symptoms at Home If you have possible or confirmed COVID-19: 1. Stay home from work and school. And stay away from other public places. If you must go out, avoid using any kind of public transportation, ridesharing, or taxis. 2. Monitor your symptoms carefully. If your symptoms get worse, call your healthcare provider immediately. 3. Get rest and stay hydrated. 4. If you have a medical appointment, call the healthcare provider ahead of time and tell them that you have or may have COVID-19. 5. For medical emergencies, call 911 and notify the dispatch personnel that you have or may have COVID-19. 6. Cover your cough and sneezes with a tissue or use the inside of your elbow. 7. Wash your hands often with soap and water for at least 20 seconds or clean your hands with an alcohol-based hand sanitizer that contains at least 60% alcohol. 8. As much as possible, stay in a specific room and away from other people in your home. Also, you should use a separate bathroom, if available. If you need to be around other people in or outside of the home, wear a mask. 9. Avoid sharing personal items with other people in your household, like dishes, towels, and bedding. 10. Clean all surfaces that are touched often, like counters, tabletops, and doorknobs. Use household cleaning sprays or wipes according to the label instructions. cdc.gov/coronavirus What types of side effects do monoclonal antibody drugs cause?  Common side effects  In general, the more common side effects caused by monoclonal antibody drugs include: . Allergic reactions, such as hives or itching . Flu-like signs and symptoms, including chills, fatigue, fever, and muscle aches and pains . Nausea, vomiting . Diarrhea . Skin rashes . Low blood pressure   The CDC is recommending patients who receive monoclonal antibody treatments wait at least 90 days before being vaccinated.  Currently, there are no  data on the safety and efficacy of mRNA COVID-19 vaccines in persons who received monoclonal antibodies or convalescent plasma as part of COVID-19 treatment. Based on the estimated half-life of such therapies as well as evidence suggesting that reinfection is uncommon in the 90 days after initial infection, vaccination should be deferred for at least 90 days, as a precautionary measure until additional information becomes available, to avoid interference of the antibody treatment with vaccine-induced immune responses. 

## 2019-12-07 IMAGING — MG DIGITAL DIAGNOSTIC BILATERAL MAMMOGRAM WITH TOMO AND CAD
6 of 12 series · 6 of 36 positions shown · non-contrast
Comparison: Previous exam(s).

CLINICAL DATA: 52-year-old patient presents for evaluation of a
palpable lump in the medial left breast. She is due for her annual
exam.

EXAM:
DIGITAL DIAGNOSTIC BILATERAL MAMMOGRAM WITH CAD AND TOMO
ULTRASOUND LEFT BREAST

[L CC synth-2D]
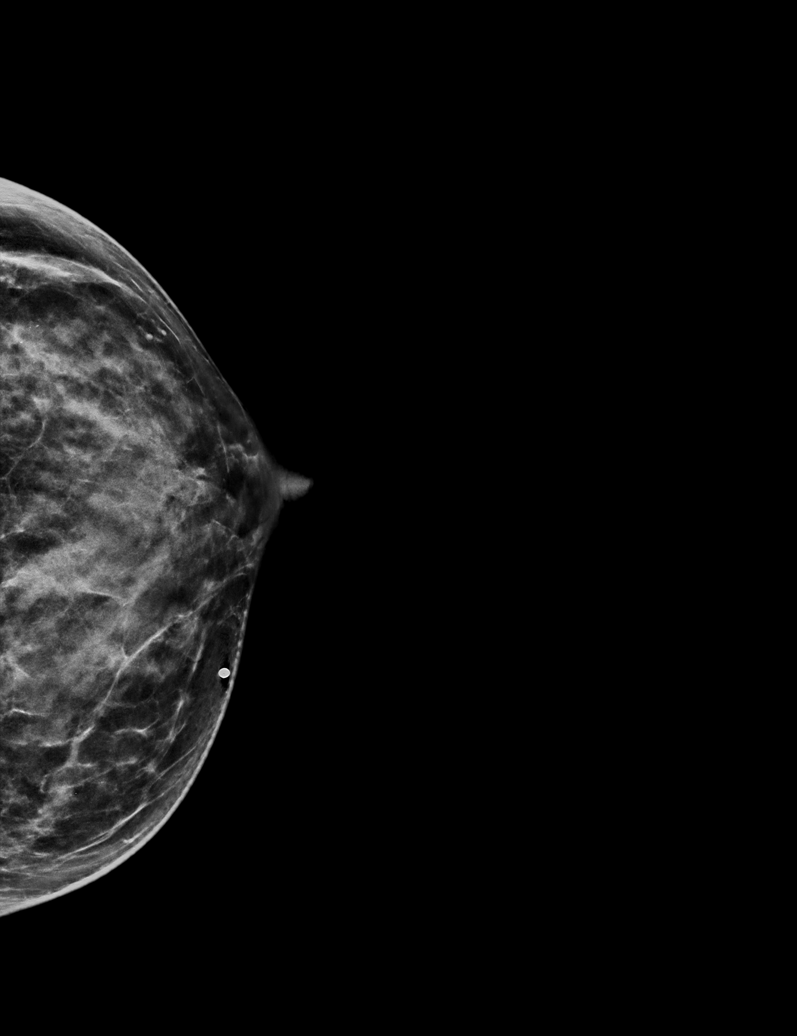

[R XCCL synth-2D]
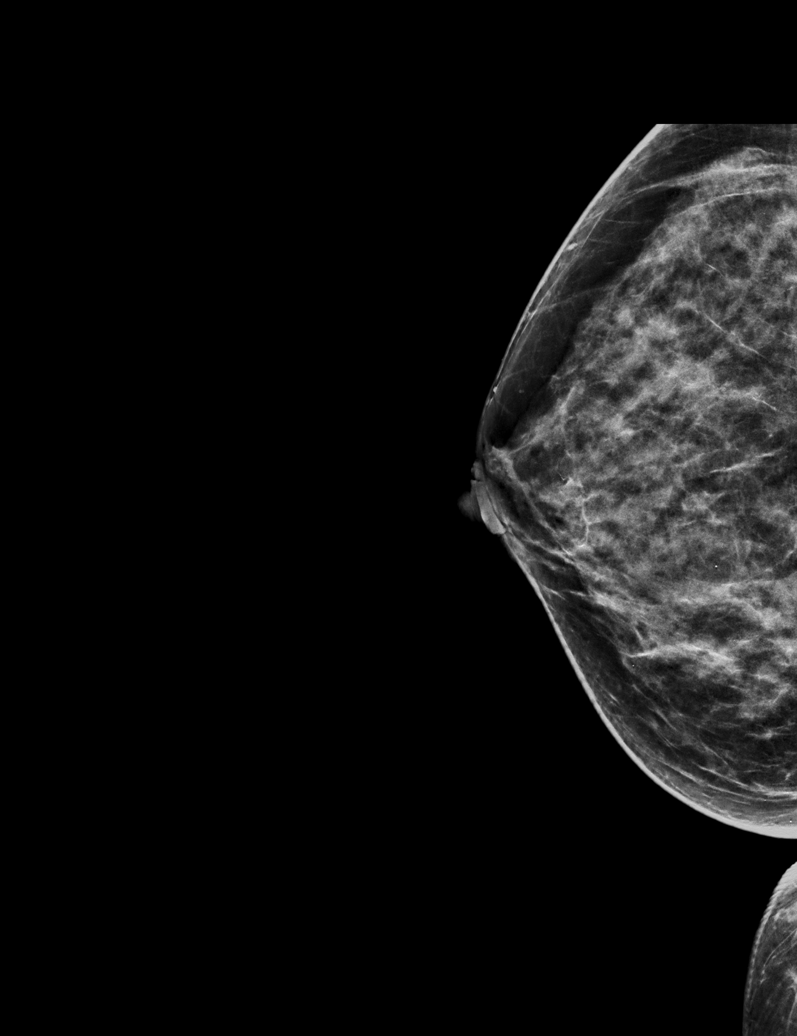

[R MLO synth-2D]
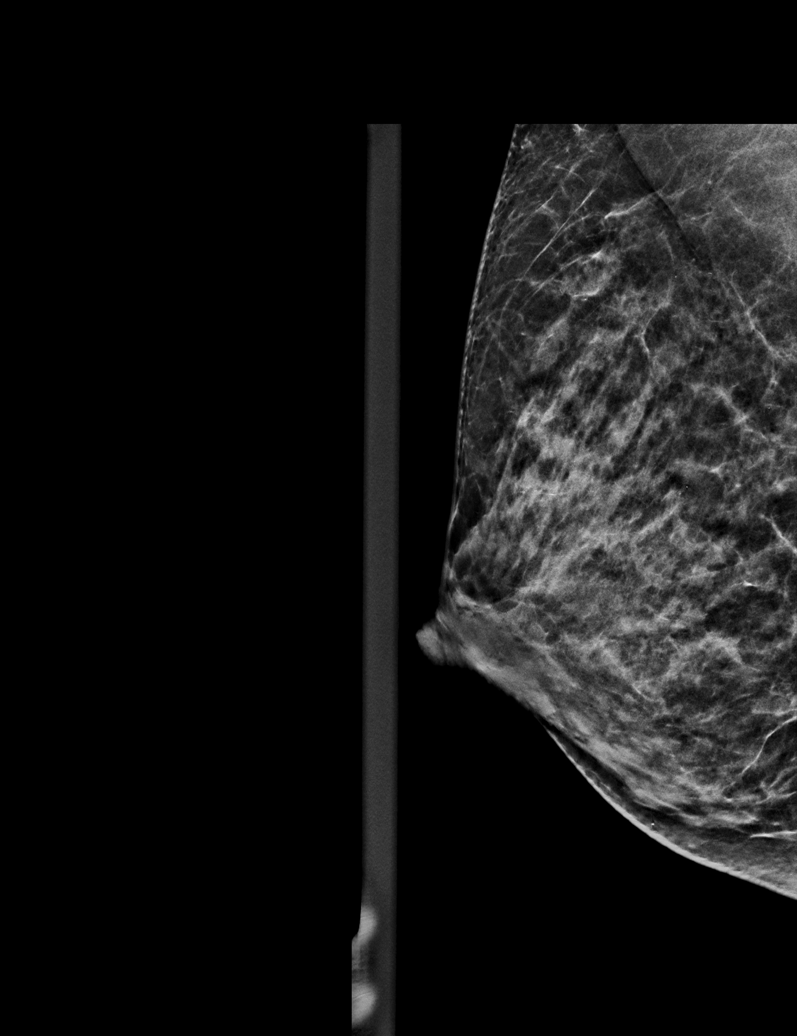

[R CC synth-2D]
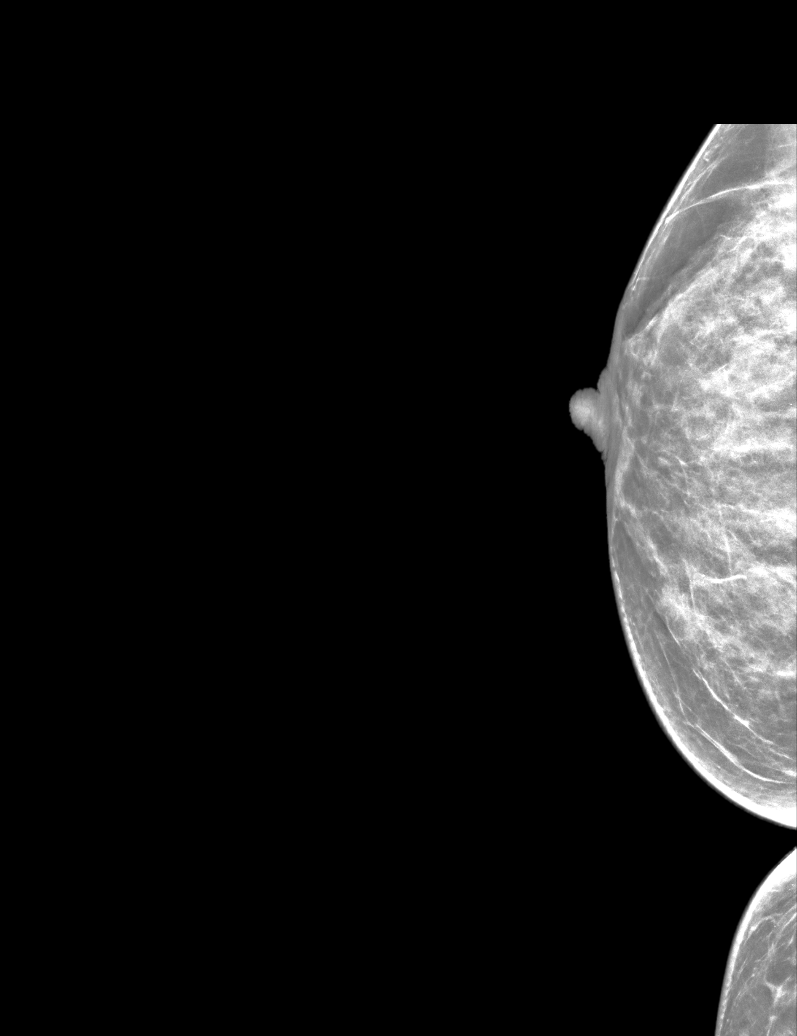

[L TAN synth-2D]
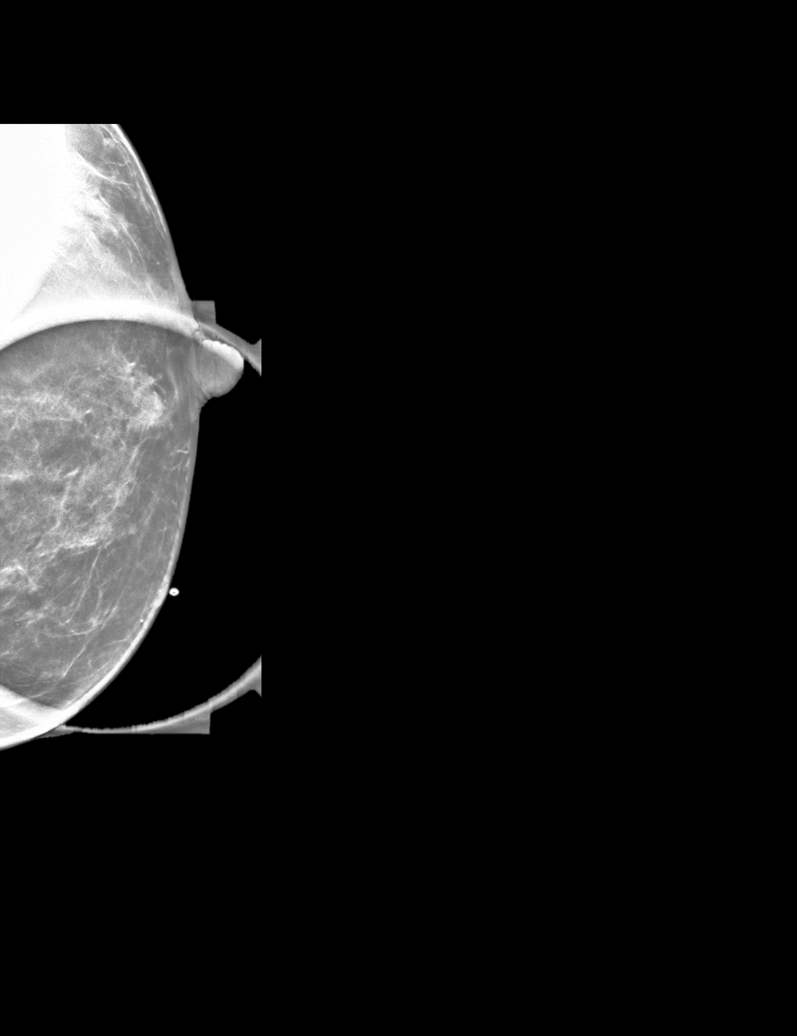

[L MLO synth-2D]
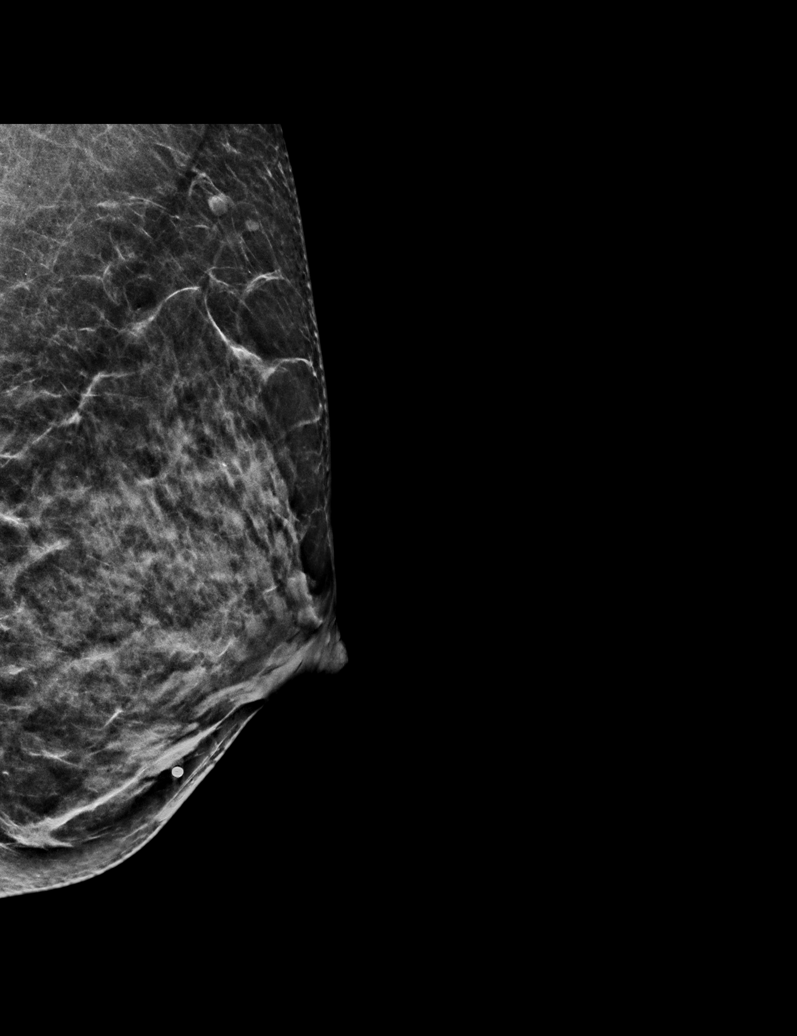

[6 of 36 positions shown; findings below may reference images not displayed]

ACR Breast Density Category d: The breast tissue is extremely dense,
which lowers the sensitivity of mammography.
FINDINGS: Metallic skin marker was placed in the region of patient concern in
the medial left breast. No mass, architectural distortion, or
suspicious microcalcification is identified in either breast to
suggest malignancy.

Mammographic images were processed with CAD.

On physical exam, I palpate what feels like a rib in the region of
patient concern in the 8 to 9 o'clock position of the left breast.
No discrete mass is palpated in the breast tissue.

Targeted ultrasound is performed, showing normal fibroglandular
breast tissue. A rib is imaged in the region of patient concern.
There is no breast solid mass, cystic mass, or abnormal shadowing.
IMPRESSION: No evidence of malignancy in either breast. The patient is palpating
a rib.

RECOMMENDATION:
Screening mammogram in one year.(Code:[id number]-[id number])

I have discussed the findings and recommendations with the patient.
Results were also provided in writing at the conclusion of the
visit. If applicable, a reminder letter will be sent to the patient
regarding the next appointment.

BI-RADS CATEGORY  1: Negative.

## 2020-04-24 DIAGNOSIS — Z0000 Encounter for general adult medical examination without abnormal findings: Secondary | ICD-10-CM | POA: Diagnosis not present

## 2020-04-24 DIAGNOSIS — Z13220 Encounter for screening for lipoid disorders: Secondary | ICD-10-CM | POA: Diagnosis not present

## 2020-05-15 IMAGING — CT CT ABDOMEN AND PELVIS WITHOUT CONTRAST
2 of 4 series · 16 of 46 positions shown, 18 images · non-contrast
Comparison: 04/18/2015

CLINICAL DATA: Vaginal bleeding, lower abdominal pain

EXAM:
CT ABDOMEN AND PELVIS WITHOUT CONTRAST
TECHNIQUE: Multidetector CT imaging of the abdomen and pelvis was performed
following the standard protocol without IV contrast.

[Series 3: ap without · axial · non-contrast · 0.66mm/px · z∈[-772,-418]mm · 13 of 79 slices shown, 15 images]
[im 4/79  soft-tissue]
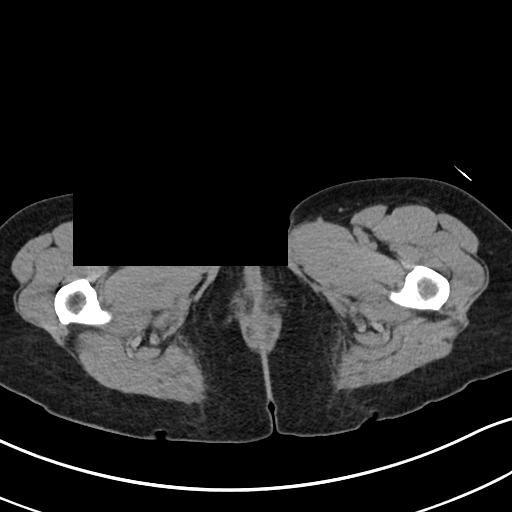
[im 4/79  bone]
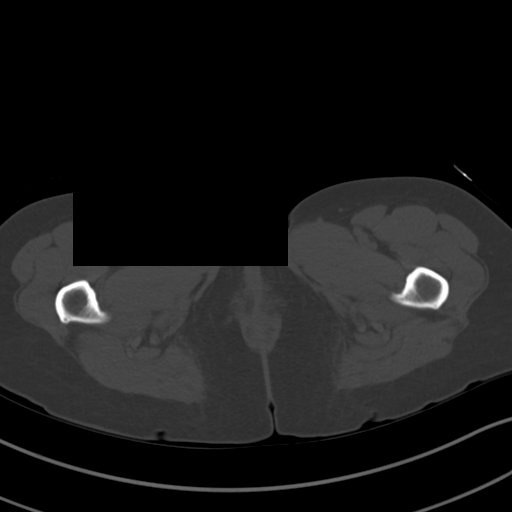
[im 11/79  soft-tissue]
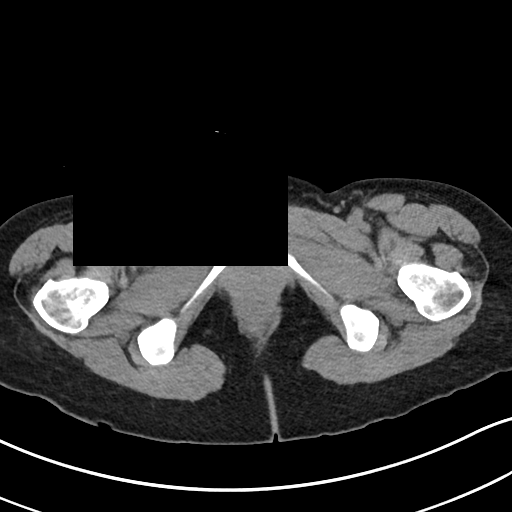
[im 17/79  soft-tissue]
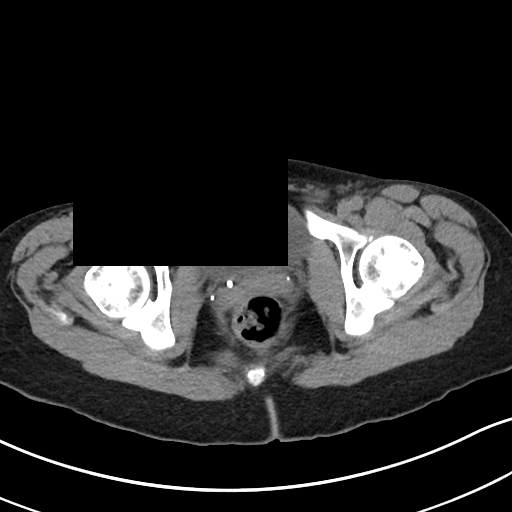
[im 21/79  soft-tissue]
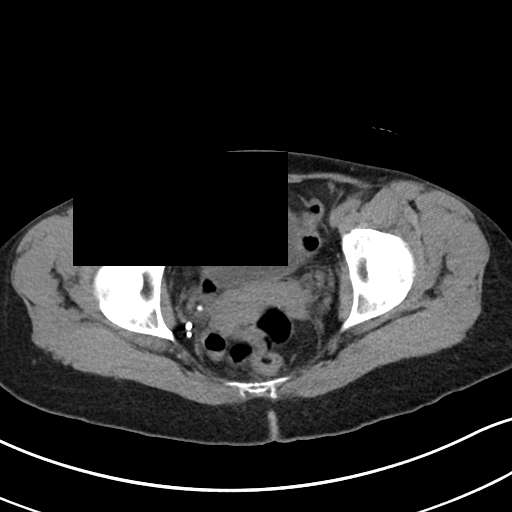
[im 28/79  soft-tissue]
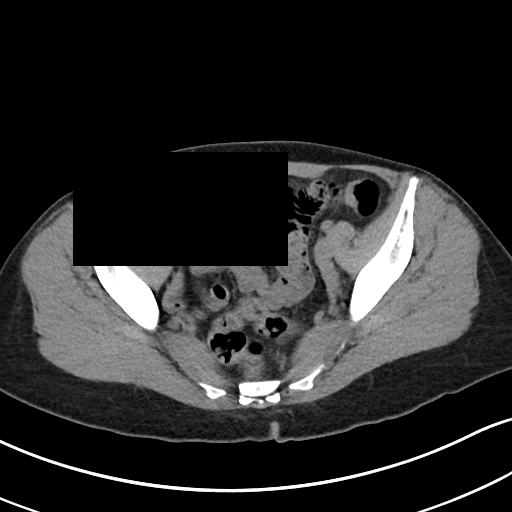
[im 34/79  soft-tissue]
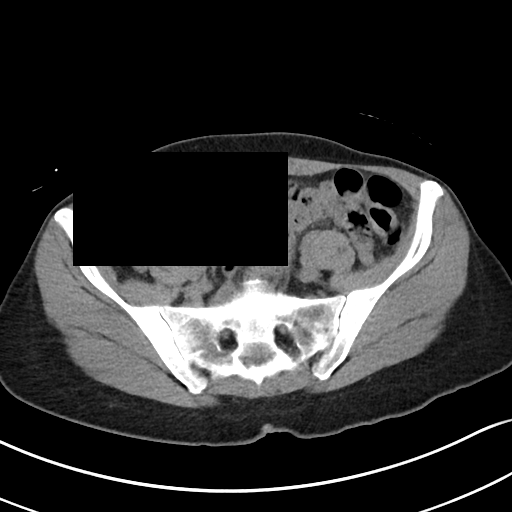
[im 41/79  soft-tissue]
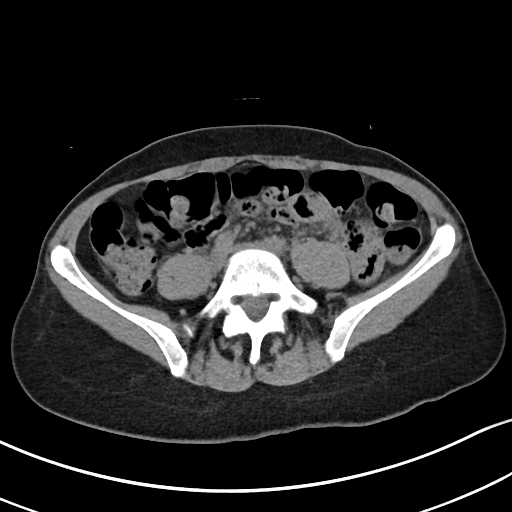
[im 45/79  soft-tissue]
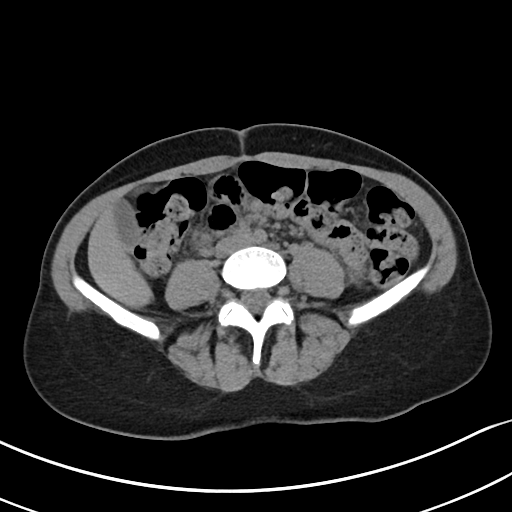
[im 51/79  soft-tissue]
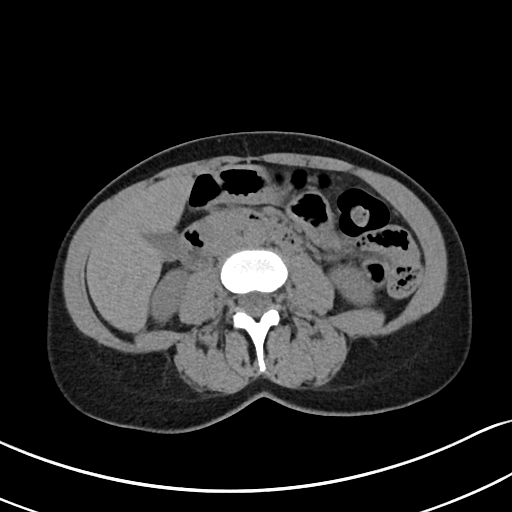
[im 51/79  bone]
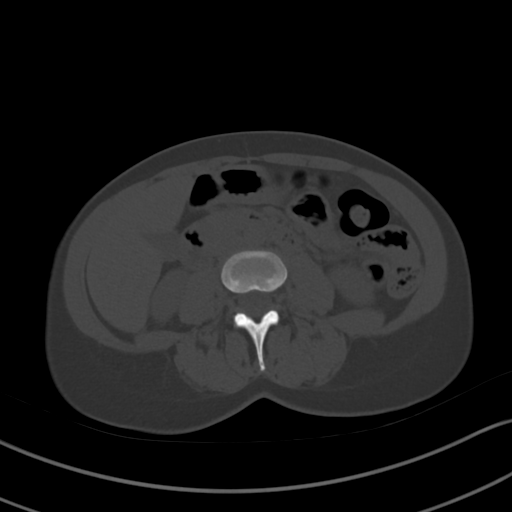
[im 58/79  soft-tissue]
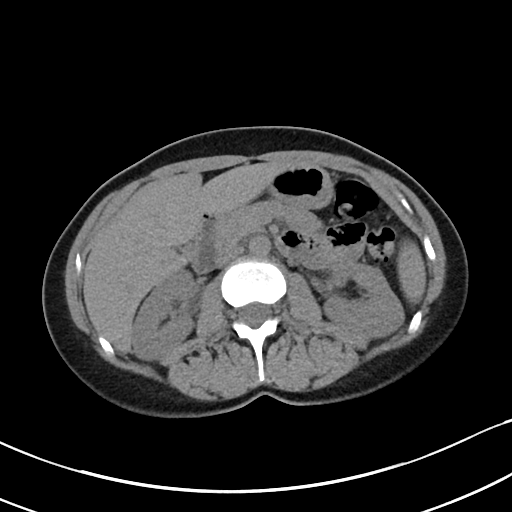
[im 62/79  soft-tissue]
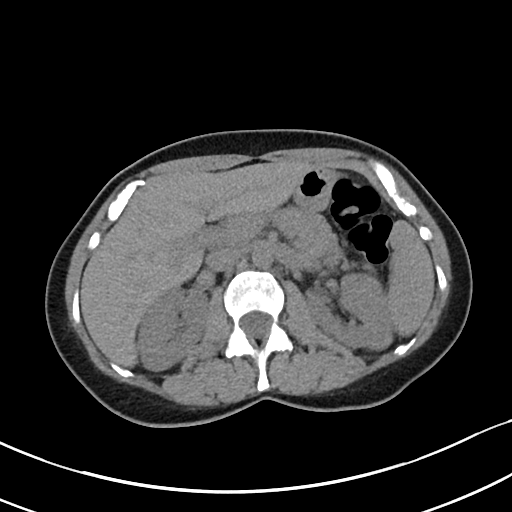
[im 68/79  soft-tissue]
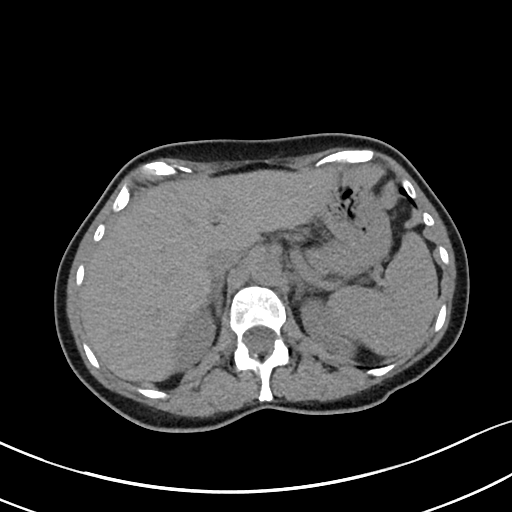
[im 75/79  soft-tissue]
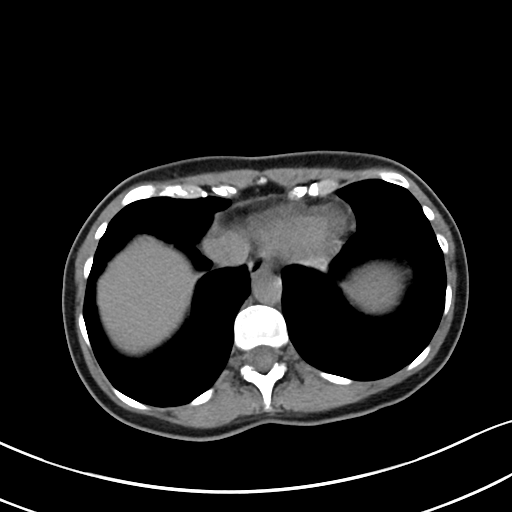

[Series 6: cor · coronal · 0.61mm/px · 3 of 69 slices shown]
[im 23/69  soft-tissue]
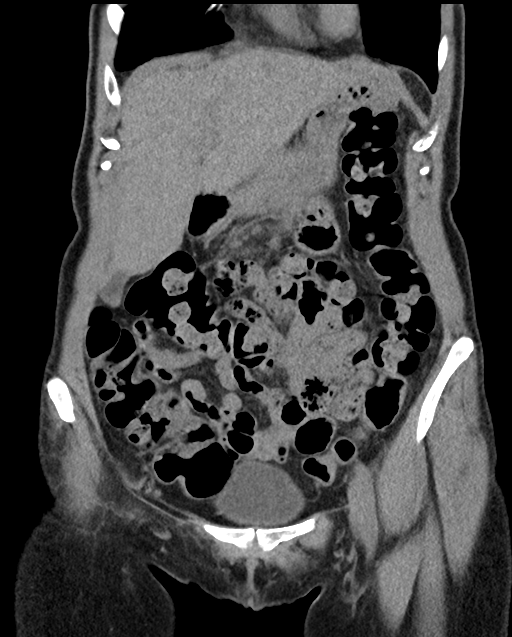
[im 31/69  soft-tissue]
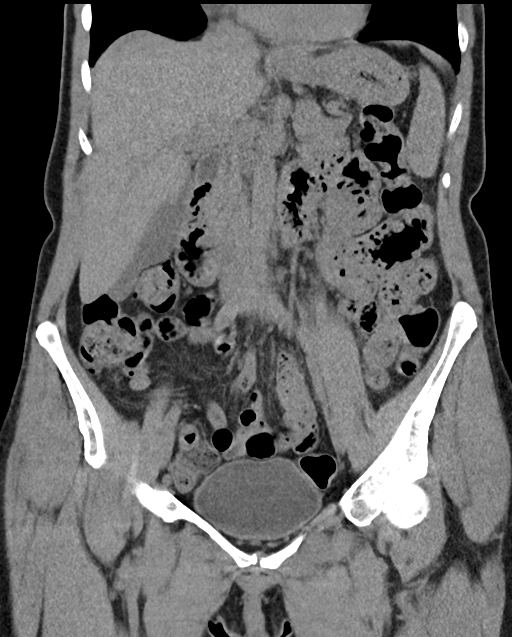
[im 38/69  soft-tissue]
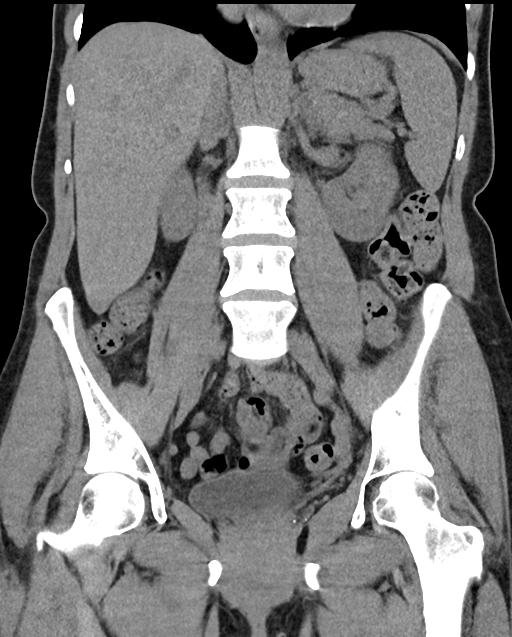

[16 of 46 positions shown; findings below may reference images not displayed]

FINDINGS: Lower chest: No acute abnormality.

Hepatobiliary: No focal liver abnormality is seen. No gallstones,
gallbladder wall thickening, or biliary dilatation.

Pancreas: Unremarkable. No pancreatic ductal dilatation or
surrounding inflammatory changes.

Spleen: Normal in size without focal abnormality.

Adrenals/Urinary Tract: Adrenal glands are unremarkable. Kidneys are
normal, without renal calculi, focal lesion, or hydronephrosis.
Bladder is unremarkable.

Stomach/Bowel: Stomach is within normal limits. Appendix appears
normal. No evidence of bowel wall thickening, distention, or
inflammatory changes.

Vascular/Lymphatic: No significant vascular findings are present. No
enlarged abdominal or pelvic lymph nodes.

Reproductive: No mass or other abnormality. Postoperative findings
of partial hysterectomy.

Other: No abdominal wall hernia or abnormality. No abdominopelvic
ascites.

Musculoskeletal: No acute or significant osseous findings.
IMPRESSION: No non-contrast CT findings of the abdomen or pelvis to explain
vaginal bleeding. There are postoperative findings of partial
hysterectomy.

## 2021-07-09 ENCOUNTER — Ambulatory Visit (INDEPENDENT_AMBULATORY_CARE_PROVIDER_SITE_OTHER): Payer: 59 | Admitting: Physician Assistant

## 2021-07-09 ENCOUNTER — Encounter: Payer: Self-pay | Admitting: Physician Assistant

## 2021-07-09 VITALS — BP 152/77 | HR 68 | Temp 98.3°F | Ht 66.0 in | Wt 128.2 lb

## 2021-07-09 DIAGNOSIS — F418 Other specified anxiety disorders: Secondary | ICD-10-CM

## 2021-07-09 DIAGNOSIS — R251 Tremor, unspecified: Secondary | ICD-10-CM | POA: Diagnosis not present

## 2021-07-09 DIAGNOSIS — Z13220 Encounter for screening for lipoid disorders: Secondary | ICD-10-CM | POA: Diagnosis not present

## 2021-07-09 DIAGNOSIS — G894 Chronic pain syndrome: Secondary | ICD-10-CM | POA: Diagnosis not present

## 2021-07-09 DIAGNOSIS — F5101 Primary insomnia: Secondary | ICD-10-CM

## 2021-07-09 DIAGNOSIS — R739 Hyperglycemia, unspecified: Secondary | ICD-10-CM | POA: Diagnosis not present

## 2021-07-09 DIAGNOSIS — R5383 Other fatigue: Secondary | ICD-10-CM

## 2021-07-09 DIAGNOSIS — R290 Tetany: Secondary | ICD-10-CM | POA: Diagnosis not present

## 2021-07-09 DIAGNOSIS — G249 Dystonia, unspecified: Secondary | ICD-10-CM

## 2021-07-09 LAB — CBC WITH DIFFERENTIAL/PLATELET
Basophils Absolute: 0 10*3/uL (ref 0.0–0.1)
Basophils Relative: 0.5 % (ref 0.0–3.0)
Eosinophils Absolute: 0.1 10*3/uL (ref 0.0–0.7)
Eosinophils Relative: 2 % (ref 0.0–5.0)
HCT: 42.3 % (ref 36.0–46.0)
Hemoglobin: 14.2 g/dL (ref 12.0–15.0)
Lymphocytes Relative: 31.6 % (ref 12.0–46.0)
Lymphs Abs: 1.8 10*3/uL (ref 0.7–4.0)
MCHC: 33.5 g/dL (ref 30.0–36.0)
MCV: 97.4 fl (ref 78.0–100.0)
Monocytes Absolute: 0.4 10*3/uL (ref 0.1–1.0)
Monocytes Relative: 7 % (ref 3.0–12.0)
Neutro Abs: 3.4 10*3/uL (ref 1.4–7.7)
Neutrophils Relative %: 58.9 % (ref 43.0–77.0)
Platelets: 175 10*3/uL (ref 150.0–400.0)
RBC: 4.34 Mil/uL (ref 3.87–5.11)
RDW: 12.8 % (ref 11.5–15.5)
WBC: 5.7 10*3/uL (ref 4.0–10.5)

## 2021-07-09 LAB — VITAMIN B12: Vitamin B-12: 455 pg/mL (ref 211–911)

## 2021-07-09 LAB — COMPREHENSIVE METABOLIC PANEL
ALT: 12 U/L (ref 0–35)
AST: 20 U/L (ref 0–37)
Albumin: 4.7 g/dL (ref 3.5–5.2)
Alkaline Phosphatase: 73 U/L (ref 39–117)
BUN: 11 mg/dL (ref 6–23)
CO2: 27 mEq/L (ref 19–32)
Calcium: 9.8 mg/dL (ref 8.4–10.5)
Chloride: 106 mEq/L (ref 96–112)
Creatinine, Ser: 0.78 mg/dL (ref 0.40–1.20)
GFR: 85.96 mL/min (ref >60.00)
Glucose, Bld: 110 mg/dL — ABNORMAL HIGH (ref 70–99)
Potassium: 4.5 mEq/L (ref 3.5–5.1)
Sodium: 142 mEq/L (ref 135–145)
Total Bilirubin: 0.9 mg/dL (ref 0.2–1.2)
Total Protein: 6.9 g/dL (ref 6.0–8.3)

## 2021-07-09 LAB — LIPID PANEL
Cholesterol: 148 mg/dL (ref 0–200)
HDL: 65 mg/dL (ref >39.00)
LDL Cholesterol: 70 mg/dL (ref 0–99)
NonHDL: 83.07
Total CHOL/HDL Ratio: 2
Triglycerides: 67 mg/dL (ref 0.0–149.0)
VLDL: 13.4 mg/dL (ref 0.0–40.0)

## 2021-07-09 LAB — VITAMIN D 25 HYDROXY (VIT D DEFICIENCY, FRACTURES): VITD: 40.05 ng/mL (ref 30.00–100.00)

## 2021-07-09 LAB — TSH: TSH: 2.51 u[IU]/mL (ref 0.35–5.50)

## 2021-07-09 LAB — MAGNESIUM: Magnesium: 2.1 mg/dL (ref 1.5–2.5)

## 2021-07-09 LAB — HEMOGLOBIN A1C: Hgb A1c MFr Bld: 5.8 % (ref 4.6–6.5)

## 2021-07-09 NOTE — Progress Notes (Signed)
Subjective:    Patient ID: [name], female    DOB: 10/07/1965, 56 y.o.   MRN: [id number]  Chief Complaint  Patient presents with   Establish Care    Pt here in office to establish care with PCP; Pt was Dx with Parkinson's estimated 5 yrs ago and runs on Father's side of family, and also Dx with MS and cervical dystonia. Pt says symptoms are getting worse; pt c/o pain but doesn't want to be on lots of medication; aso suffers with anxiety. Pt is fasting today and wanting to do full blood panel since been so long since doing blood work. Pt needs to schedule Mammogram; pt needs Pap smear but had a partial hysterectomy; and needs to schedule Colonoscopy    HPI 56 y.o. patient presents today for new patient establishment with me.  Patient was previously established with Ashley GemmaKristen Kaplan, [name]. Says it's been awhile - without insurance in that time. Son drove her to appointment today - says she has bad panic attacks with driving.   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 -Left hand locking up; muscle spasm / cramping Left calf; starting to get symptoms in R calf -Wants help, but doesn't want to take medications  Depression / anxiety / panic -Previously on lexapro / counseling - but says when she starts feeling better she quits treatment -Requesting Klonopin refill, says she hasn't had this in awhile, but claims it is the only thing that has helped in the past -[facility] previously - helps with PTSD   Past Medical History:  Diagnosis Date   Anxiety    Depression    Movement disorder    MS (multiple sclerosis) (HCC)    Other specified cardiac arrhythmias 1990s   Panic attacks    Polyneuritis    PTSD (post-traumatic stress disorder)    Tremors of nervous system     Past Surgical History:   Procedure Laterality Date   ABDOMINAL HYSTERECTOMY  late 1990s   ADENOIDECTOMY     FOOT FRACTURE SURGERY Right    OVARIAN CYST REMOVAL      Family History  Problem Relation Age of Onset   Parkinson's disease Father    Stroke Father    Diabetes Mother    Hypertension Mother    Heart attack Maternal Aunt    Heart attack Maternal Uncle    Cancer Maternal Grandmother        ovarian cancer   Heart attack Maternal Grandmother    Cerebrovascular Accident Maternal Grandmother    Heart attack Maternal Grandfather    Cerebrovascular Accident Maternal Grandfather    Stroke Paternal Grandmother    Diabetes Paternal Grandmother    Congestive Heart Failure Paternal Grandmother    Stroke Paternal Grandfather    Parkinson's disease Paternal Grandfather    Heart attack Maternal Uncle    Movement disorder Sister    Parkinson's disease Paternal Aunt        3 daughters also have Parkinsons   Parkinson's disease Paternal Aunt    Parkinson's disease Paternal Aunt    Parkinson's disease Paternal Uncle     Social History   Tobacco Use   Smoking status: Never   Smokeless tobacco: Never  Substance Use Topics   Alcohol use: No    Comment: previous heavy drinker. quit in the  35s   Drug use: Yes    Types: Cocaine, Marijuana    Comment: no cocain since the 80's. occ marijuana (1 /mo)     Allergies  Allergen Reactions   Penicillins Anaphylaxis   Contrast Media [Iodinated Contrast Media] Other (See Comments)    unknown   Sulfa Antibiotics Rash    Review of Systems NEGATIVE UNLESS OTHERWISE INDICATED IN HPI      Objective:     BP (!) 152/77 (BP Location: Right Arm)   Pulse 68   Temp 98.3 F (36.8 C) (Temporal)   Ht 5\' 6"  (1.676 m)   Wt 128 lb 3.2 oz (58.2 kg)   SpO2 98%   BMI 20.69 kg/m   Wt Readings from Last 3 Encounters:  07/09/21 128 lb 3.2 oz (58.2 kg)  04/21/19 112 lb (50.8 kg)  07/22/18 115 lb (52.2 kg)    BP Readings from Last 3 Encounters:  07/09/21 (!)  152/77  04/21/19 112/75  04/27/18 115/76     Physical Exam Vitals and nursing note reviewed.  Constitutional:      Appearance: Normal appearance. She is normal weight. She is not toxic-appearing.  HENT:     Head: Normocephalic and atraumatic.     Right Ear: External ear normal.     Left Ear: External ear normal.     Nose: Nose normal.     Mouth/Throat:     Mouth: Mucous membranes are moist.  Eyes:     Extraocular Movements: Extraocular movements intact.     Conjunctiva/sclera: Conjunctivae normal.     Pupils: Pupils are equal, round, and reactive to light.  Cardiovascular:     Rate and Rhythm: Normal rate and regular rhythm.     Pulses: Normal pulses.     Heart sounds: Normal heart sounds.  Pulmonary:     Effort: Pulmonary effort is normal.     Breath sounds: Normal breath sounds.  Skin:    General: Skin is warm and dry.  Neurological:     General: No focal deficit present.     Mental Status: She is alert and oriented to person, place, and time.     Comments: Tremors present Involuntary neck movements present  Psychiatric:        Mood and Affect: Mood is depressed. Affect is tearful.        Behavior: Behavior normal.        Thought Content: Thought content normal.        Assessment & Plan:   Problem List Items Addressed This Visit       Nervous and Auditory   Dystonia - Primary   Relevant Orders   Comprehensive metabolic panel (Completed)   Magnesium (Completed)   Ambulatory referral to Physical Medicine Rehab     Other   Tremor   Relevant Orders   Comprehensive metabolic panel (Completed)   Magnesium (Completed)   Ambulatory referral to Physical Medicine Rehab   Other Visit Diagnoses     Tet[place]       Relevant Orders   Comprehensive metabolic panel (Completed)   Magnesium (Completed)   Ambulatory referral to Physical Medicine Rehab   Chronic pain syndrome       Relevant Orders   Iron, TIBC and Ferritin Panel   VITAMIN D 25 Hydroxy (Vit-D  Deficiency, Fractures) (Completed)   Vitamin B12 (Completed)   TSH (Completed)   Comprehensive metabolic panel (Completed)   CBC with Differential/Platelet (Completed)   Magnesium (Completed)   Ambulatory referral to Physical  Medicine Rehab   Mixed anxiety and depressive disorder       Relevant Orders   Ambulatory referral to Physical Medicine Rehab   Primary insomnia       Relevant Orders   Ambulatory referral to Physical Medicine Rehab   Other fatigue       Relevant Orders   Iron, TIBC and Ferritin Panel   VITAMIN D 25 Hydroxy (Vit-D Deficiency, Fractures) (Completed)   Vitamin B12 (Completed)   TSH (Completed)   Comprehensive metabolic panel (Completed)   CBC with Differential/Platelet (Completed)   Hemoglobin A1c (Completed)   Magnesium (Completed)   Ambulatory referral to Physical Medicine Rehab   Hyperglycemia       Relevant Orders   Comprehensive metabolic panel (Completed)   Hemoglobin A1c (Completed)   Screening for cholesterol level       Relevant Orders   Lipid panel (Completed)      PLAN: -new patient establishment -labs as requested, treat pending results -advised [facility] for therapy at this time -Refer to Physical medicine; she may need to consider consult with neuro again, but she's not sure that's the route she wants to pursue right now   Return in about 2 months (around 09/08/2021) for recheck in-person or virtual .  This note was prepared with assistance of Dragon voice recognition software. Occasional wrong-word or sound-a-like substitutions may have occurred due to the inherent limitations of voice recognition software.   Ammar Moffatt M Jaylean Buenaventura, [name]

## 2021-07-09 NOTE — Patient Instructions (Addendum)
Welcome to [facility]! It was a pleasure meeting you today.  Please go to the lab for blood work today. Treat pending results.  Look into [facility] for therapy.  Referral to Dr. Dagoberto Ligas with physical medicine.   As discussed, Please schedule a 2 month follow up visit today.  PLEASE NOTE:  If you had any LAB tests please let us know if you have not heard back within a few days. You may see your results on MyChart before we have a chance to review them but we will give you a call once they are reviewed by [place]. If we ordered any REFERRALS today, please let us know if you have not heard from their office within the next two weeks. Let us know through MyChart if you are needing REFILLS, or have your pharmacy send [place] the request. You can also use MyChart to communicate with me or any office staff.  Please try these tips to maintain a healthy lifestyle:  Eat most of your calories during the day when you are active. Eliminate processed foods including packaged sweets (pies, cakes, cookies), reduce intake of potatoes, white bread, white pasta, and white rice. Look for whole grain options, oat flour or almond flour.  Each meal should contain half fruits/vegetables, one quarter protein, and one quarter carbs (no bigger than a computer mouse).  Cut down on sweet beverages. This includes juice, soda, and sweet tea. Also watch fruit intake, though this is a healthier sweet option, it still contains natural sugar! Limit to 3 servings daily.  Drink at least 1 glass of water with each meal and aim for at least 8 glasses (64 ounces) per day.  Exercise at least 150 minutes every week to the best of your ability.    Take Care,  Khadeem Rockett, [name]

## 2021-07-17 ENCOUNTER — Ambulatory Visit: Payer: Self-pay | Admitting: Nurse Practitioner

## 2021-07-17 ENCOUNTER — Encounter: Payer: Self-pay | Admitting: Physician Assistant

## 2021-08-21 ENCOUNTER — Encounter: Payer: Self-pay | Admitting: Physical Medicine and Rehabilitation

## 2021-09-07 ENCOUNTER — Encounter: Payer: Self-pay | Admitting: Physician Assistant

## 2021-09-07 ENCOUNTER — Ambulatory Visit (INDEPENDENT_AMBULATORY_CARE_PROVIDER_SITE_OTHER): Payer: 59 | Admitting: Physician Assistant

## 2021-09-07 VITALS — BP 120/78 | HR 63 | Temp 98.1°F | Ht 66.0 in | Wt 126.0 lb

## 2021-09-07 DIAGNOSIS — F418 Other specified anxiety disorders: Secondary | ICD-10-CM

## 2021-09-07 DIAGNOSIS — N6324 Unspecified lump in the left breast, lower inner quadrant: Secondary | ICD-10-CM

## 2021-09-07 DIAGNOSIS — Z1211 Encounter for screening for malignant neoplasm of colon: Secondary | ICD-10-CM | POA: Diagnosis not present

## 2021-09-07 DIAGNOSIS — G894 Chronic pain syndrome: Secondary | ICD-10-CM | POA: Diagnosis not present

## 2021-09-07 MED ORDER — CLONAZEPAM 0.5 MG PO TABS: 0.5000 mg | ORAL_TABLET | Freq: Two times a day (BID) | ORAL | 2 refills | Status: DC | PRN

## 2021-09-07 MED ORDER — SERTRALINE HCL 25 MG PO TABS: 25.0000 mg | ORAL_TABLET | Freq: Every day | ORAL | 3 refills | Status: DC

## 2021-09-07 NOTE — Progress Notes (Signed)
Subjective:    Patient ID: [name], female    DOB: 05-Sep-1965, 56 y.o.   MRN: [id number]  Chief Complaint  Patient presents with   Follow-up    Pt here for f/u; pt states panic attacks aren't getting any better; pt is losing insurance at the end of the month, ordered Cologuard since no hx of colon cancer in family; mammogram being scheduled as well since pt c/o lump on left breast and pt overdue; also wants to discuss doing pap here in office with PCP;     HPI Patient is in today for 2 month f/up.  Left breast - "lump" present; was previously told this was a rib, but pt states this is not a rib there is a 'knot' present; last diagnostic mammo was 11/18/2017, negative.   Hx partial hysterectomy with Dr. 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    Anxiety - still having panic attacks. Klonopin helps to take the edge off. Functions well, no side effects, usually only takes 1/2 0.5 mg as needed. States she has been without this medicine for a long time now. Also says she is very depressed, would be interested in medicine to help with this too.    Past Medical History:  Diagnosis Date   Anxiety    Depression    Movement disorder    MS (multiple sclerosis) (HCC)    Other specified cardiac arrhythmias 1990s   Panic attacks    Polyneuritis    PTSD (post-traumatic stress disorder)    Tremors of nervous system     Past Surgical History:  Procedure Laterality Date   ABDOMINAL HYSTERECTOMY  late 1990s   ADENOIDECTOMY     FOOT FRACTURE SURGERY Right    OVARIAN CYST REMOVAL      Family History  Problem Relation Age of Onset   Parkinson's disease Father    Stroke Father    Diabetes Mother    Hypertension Mother    Heart attack Maternal Aunt    Heart attack Maternal Uncle    Cancer Maternal Grandmother        ovarian cancer   Heart attack Maternal Grandmother    Cerebrovascular Accident Maternal Grandmother     Heart attack Maternal Grandfather    Cerebrovascular Accident Maternal Grandfather    Stroke Paternal Grandmother    Diabetes Paternal Grandmother    Congestive Heart Failure Paternal Grandmother    Stroke Paternal Grandfather    Parkinson's disease Paternal Grandfather    Heart attack Maternal Uncle    Movement disorder Sister    Parkinson's disease Paternal Aunt        3 daughters also have Parkinsons   Parkinson's disease Paternal Aunt    Parkinson's disease Paternal Aunt    Parkinson's disease Paternal Uncle     Social History   Tobacco Use   Smoking status: Never   Smokeless tobacco: Never  Substance Use Topics   Alcohol use: No    Comment: 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 occ marijuana (1 /mo)     Allergies  Allergen Reactions   Penicillins Anaphylaxis   Contrast Media [Iodinated Contrast Media] Other (See Comments)    unknown   Sulfa Antibiotics Rash    Review of Systems NEGATIVE UNLESS OTHERWISE INDICATED IN HPI      Objective:  BP 120/78 (BP Location: Right Arm)   Pulse 63   Temp 98.1 F (36.7 C) (Temporal)   Ht 5\' 6"  (1.676 m)   Wt 126 lb (57.2 kg)   SpO2 99%   BMI 20.34 kg/m   Wt Readings from Last 3 Encounters:  09/07/21 126 lb (57.2 kg)  07/09/21 128 lb 3.2 oz (58.2 kg)  04/21/19 112 lb (50.8 kg)    BP Readings from Last 3 Encounters:  09/07/21 120/78  07/09/21 (!) 152/77  04/21/19 112/75     Physical Exam Vitals and nursing note reviewed.  Constitutional:      Appearance: Normal appearance. She is normal weight. She is not toxic-appearing.  HENT:     Head: Normocephalic and atraumatic.     Right Ear: External ear normal.     Left Ear: External ear normal.     Nose: Nose normal.     Mouth/Throat:     Mouth: Mucous membranes are moist.  Eyes:     Extraocular Movements: Extraocular movements intact.     Conjunctiva/sclera:  Conjunctivae normal.     Pupils: Pupils are equal, round, and reactive to light.  Cardiovascular:     Rate and Rhythm: Normal rate and regular rhythm.     Pulses: Normal pulses.     Heart sounds: Normal heart sounds.  Pulmonary:     Effort: Pulmonary effort is normal.     Breath sounds: Normal breath sounds.  Chest:  Breasts:    Right: No inverted nipple, mass, nipple discharge, skin change or tenderness.     Left: Mass (small approx 2 mm left inner quadrant somewhat fixed tender knot) and tenderness present. No inverted nipple, nipple discharge or skin change.  Lymphadenopathy:     Upper Body:     Right upper body: No supraclavicular, axillary or pectoral adenopathy.     Left upper body: No supraclavicular, axillary or pectoral adenopathy.  Skin:    General: Skin is warm and dry.  Neurological:     General: No focal deficit present.     Mental Status: She is alert and oriented to person, place, and time.     Comments: Tremors present Involuntary neck movements present  Psychiatric:        Mood and Affect: Mood is depressed.        Behavior: Behavior normal.        Thought Content: Thought content normal.        Assessment & Plan:   Problem List Items Addressed This Visit       Other   Breast mass   Relevant Orders   MM Digital Diagnostic Bilat   Other Visit Diagnoses     Mixed anxiety and depressive disorder    -  Primary   Relevant Medications   sertraline (ZOLOFT) 25 MG tablet   Chronic pain syndrome       Relevant Medications   clonazePAM (KLONOPIN) 0.5 MG tablet   sertraline (ZOLOFT) 25 MG tablet   Colon cancer screening       Relevant Orders   Cologuard        Meds ordered this encounter  Medications   clonazePAM (KLONOPIN) 0.5 MG tablet    Sig: Take 1 tablet (0.5 mg total) by mouth 2 (two) times daily as needed.    Dispense:  60 tablet    Refill:  2    Order Specific Question:   Supervising Provider    Answer:   [birth year]1978 [phone]  sertraline (ZOLOFT) 25 MG tablet    Sig: Take 1 tablet (25 mg total) by mouth daily.    Dispense:  30 tablet    Refill:  3    Order Specific Question:   Supervising Provider    Answer:   Durene Cal, STEPHEN O [[id number]]    1. Mixed anxiety and depressive disorder PDMP reviewed today, no red flags. Rx Zoloft 25 mg to start on daily. Pt aware of risks vs benefits and possible adverse reactions. Klonopin Rx refilled take 0.5 mg BID prn anxiety Counseling  2. Chronic pain syndrome Appt coming up with Dr. Berline Chough in November Try to keep active  3. Colon cancer screening Cologuard ordered  4. Mass of lower inner quadrant of left breast Noted on breast exam, has been there awhile per patient, will update diagnostic mammogram   Return in about 3 weeks (around 09/28/2021) for Well woman exam.    [name] Lillyanne Bradburn, [name]

## 2021-09-07 NOTE — Patient Instructions (Addendum)
Start on Zoloft once daily with breakfast. Rx Klonopin refilled to take up to twice daily as needed for anxiety. Keep working with counselor.  I'll work on getting you set up for diagnostic mammogram.  See you back for well woman exam before end of month!

## 2021-09-10 ENCOUNTER — Ambulatory Visit: Payer: 59 | Admitting: Physician Assistant

## 2021-09-24 ENCOUNTER — Encounter: Payer: Self-pay | Admitting: Physician Assistant

## 2021-09-24 ENCOUNTER — Ambulatory Visit (INDEPENDENT_AMBULATORY_CARE_PROVIDER_SITE_OTHER): Payer: 59 | Admitting: Physician Assistant

## 2021-09-24 ENCOUNTER — Other Ambulatory Visit (HOSPITAL_COMMUNITY)
Admission: RE | Admit: 2021-09-24 | Discharge: 2021-09-24 | Disposition: A | Discharge status: Home / Self Care | Payer: 59 | Source: Ambulatory Visit | Attending: Physician Assistant | Admitting: Physician Assistant

## 2021-09-24 VITALS — BP 111/68 | HR 49 | Temp 98.0°F | Ht 66.0 in | Wt 126.8 lb

## 2021-09-24 DIAGNOSIS — Z01419 Encounter for gynecological examination (general) (routine) without abnormal findings: Secondary | ICD-10-CM

## 2021-09-24 DIAGNOSIS — N6324 Unspecified lump in the left breast, lower inner quadrant: Secondary | ICD-10-CM | POA: Diagnosis not present

## 2021-09-24 DIAGNOSIS — F418 Other specified anxiety disorders: Secondary | ICD-10-CM | POA: Diagnosis not present

## 2021-09-24 NOTE — Patient Instructions (Signed)
Good to see you!   Pap smear performed - will call with results.  Let me know if you need new order for diagnostic mammo / where to send this. Call for help if needed!  See you back soon for med recheck.

## 2021-09-24 NOTE — Progress Notes (Signed)
Subjective:    Patient ID: [name], female    DOB: Feb 17, 1965, 56 y.o.   MRN: [id number]  Chief Complaint  Patient presents with   Annual Exam    HPI Patient is in today for annual exam.  Acute concerns: No acute health concerns today   Health maintenance: Lifestyle/ exercise: Tries to walk and stay busy Nutrition: Doing well  Mental health: Has been taking Zoloft 25 mg daily the last 3 weeks and seems to be helping some, no side effects Substance use: None ETOH: None  Sexual activity: Not active in the last 13 years or so with husband - stat Immunizations: Waiting on Tdap and Shingrix Colonoscopy: Received Cologuard, will send this back in  Pap: Needs to update today; partial hysterectomy - randomly still has vaginal spotting; was supposed to have an [place] awhile ago but pt says she didn't have insurance at the time  Mammogram: Trying to reschedule - states GI did not accept her insurance     Past Medical History:  Diagnosis Date   Anxiety    Depression    Movement disorder    MS (multiple sclerosis) (HCC)    Other specified cardiac arrhythmias 1990s   Panic attacks    Polyneuritis    PTSD (post-traumatic stress disorder)    Tremors of nervous system     Past Surgical History:  Procedure Laterality Date   ABDOMINAL HYSTERECTOMY  late 1990s   Dr. 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   ADENOIDECTOMY     FOOT FRACTURE SURGERY Right    OVARIAN CYST REMOVAL      Family History  Problem Relation Age of Onset   Parkinson's disease Father    Stroke Father    Diabetes Mother    Hypertension Mother    Heart attack Maternal Aunt    Heart attack Maternal Uncle    Cancer Maternal Grandmother        ovarian cancer   Heart attack Maternal Grandmother    Cerebrovascular Accident Maternal Grandmother    Heart attack Maternal Grandfather    Cerebrovascular Accident Maternal Grandfather    Stroke  Paternal Grandmother    Diabetes Paternal Grandmother    Congestive Heart Failure Paternal Grandmother    Stroke Paternal Grandfather    Parkinson's disease Paternal Grandfather    Heart attack Maternal Uncle    Movement disorder Sister    Parkinson's disease Paternal Aunt        3 daughters also have Parkinsons   Parkinson's disease Paternal Aunt    Parkinson's disease Paternal Aunt    Parkinson's disease Paternal Uncle     Social History   Tobacco Use   Smoking status: Never   Smokeless tobacco: Never  Substance Use Topics   Alcohol use: No    Comment: 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 occ marijuana (1 /mo)     Allergies  Allergen Reactions   Penicillins Anaphylaxis   Contrast Media [Iodinated Contrast Media] Other (See Comments)    unknown   Sulfa Antibiotics Rash    Review of Systems NEGATIVE UNLESS OTHERWISE INDICATED IN HPI      Objective:     BP 111/68   Pulse (!) 49   Temp 98 F (36.7 C)   Ht 5\' 6"  (1.676 m)   Wt  126 lb 12.8 oz (57.5 kg)   SpO2 96%   BMI 20.47 kg/m   Wt Readings from Last 3 Encounters:  09/24/21 126 lb 12.8 oz (57.5 kg)  09/07/21 126 lb (57.2 kg)  07/09/21 128 lb 3.2 oz (58.2 kg)    BP Readings from Last 3 Encounters:  09/24/21 111/68  09/07/21 120/78  07/09/21 (!) 152/77     Physical Exam Vitals and nursing note reviewed. Exam conducted with a chaperone present.  Constitutional:      Appearance: Normal appearance. She is normal weight. She is not toxic-appearing.  HENT:     Head: Normocephalic and atraumatic.     Right Ear: External ear normal.     Left Ear: External ear normal.     Nose: Nose normal.     Mouth/Throat:     Mouth: Mucous membranes are moist.  Eyes:     Extraocular Movements: Extraocular movements intact.     Conjunctiva/sclera: Conjunctivae normal.     Pupils: Pupils are equal, round, and reactive to light.   Cardiovascular:     Rate and Rhythm: Normal rate and regular rhythm.     Pulses: Normal pulses.     Heart sounds: Normal heart sounds.  Pulmonary:     Effort: Pulmonary effort is normal.     Breath sounds: Normal breath sounds.  Chest:  Breasts:    Right: No inverted nipple, mass, nipple discharge, skin change or tenderness.     Left: Mass (small approx 2 mm left inner quadrant somewhat fixed tender knot) and tenderness present. No inverted nipple, nipple discharge or skin change.  Genitourinary:    General: Normal vulva.     Labia:        Right: No rash or tenderness.        Left: No rash or tenderness.      Vagina: Normal.     Uterus: Absent.      Comments: Could not get full view of cervix due to pt discomfort Lymphadenopathy:     Upper Body:     Right upper body: No supraclavicular, axillary or pectoral adenopathy.     Left upper body: No supraclavicular, axillary or pectoral adenopathy.  Skin:    General: Skin is warm and dry.  Neurological:     General: No focal deficit present.     Mental Status: She is alert and oriented to person, place, and time.     Comments: Tremors present Involuntary neck movements present  Psychiatric:        Mood and Affect: Mood is depressed.        Behavior: Behavior normal.        Thought Content: Thought content normal.        Assessment & Plan:   Problem List Items Addressed This Visit       Other   Breast mass   Other Visit Diagnoses     Encounter for well woman exam with routine gynecological exam    -  Primary   Relevant Orders   Cytology - PAP   Mixed anxiety and depressive disorder          Plan: Age-appropriate screening and counseling performed today. Recent labs already done and normal. Checked pap smear today, will call with results. Preventive measures discussed and printed in AVS for patient.   Anxiety and depression is improving - close f/up with me- cont Zoloft 25 mg and Klonopin 0.5 mg BID for  now  Breast mass - pt rescheduling  for diagnostic mammogram   Patient Counseling: [x]   Nutrition: Stressed importance of moderation in sodium/caffeine intake, saturated fat and cholesterol, caloric balance, sufficient intake of fresh fruits, vegetables, and fiber.  [x]   Stressed the importance of regular exercise.   []   Substance Abuse: Discussed cessation/primary prevention of tobacco, alcohol, or other drug use; driving or other dangerous activities under the influence; availability of treatment for abuse.   [x]   Injury prevention: Discussed safety belts, safety helmets, smoke detector, smoking near bedding or upholstery.   []   Sexuality: Discussed sexually transmitted diseases, partner selection, use of condoms, avoidance of unintended pregnancy  and contraceptive alternatives.   [x]   Dental health: Discussed importance of regular tooth brushing, flossing, and dental visits.  [x]   Health maintenance and immunizations reviewed. Please refer to Health maintenance section.      Return in about 4 weeks (around 10/22/2021) for med recheck .    Yannis Gumbs M Oreatha Fabry, [name]

## 2021-09-27 LAB — CYTOLOGY - PAP
Adequacy: ABSENT
Comment: NEGATIVE
Diagnosis: NEGATIVE
High risk HPV: NEGATIVE

## 2021-10-11 LAB — COLOGUARD: COLOGUARD: NEGATIVE

## 2021-10-22 ENCOUNTER — Ambulatory Visit: Payer: 59 | Admitting: Physician Assistant

## 2021-10-29 ENCOUNTER — Encounter: Payer: Self-pay | Admitting: *Deleted

## 2021-12-14 ENCOUNTER — Encounter: Payer: Self-pay | Admitting: Physical Medicine and Rehabilitation

## 2021-12-14 ENCOUNTER — Encounter
Payer: Medicaid Other | Attending: Physical Medicine and Rehabilitation | Admitting: Physical Medicine and Rehabilitation

## 2021-12-14 VITALS — BP 134/79 | HR 62 | Ht 66.0 in | Wt 130.0 lb

## 2021-12-14 DIAGNOSIS — G249 Dystonia, unspecified: Secondary | ICD-10-CM | POA: Insufficient documentation

## 2021-12-14 DIAGNOSIS — R251 Tremor, unspecified: Secondary | ICD-10-CM | POA: Insufficient documentation

## 2021-12-14 DIAGNOSIS — M792 Neuralgia and neuritis, unspecified: Secondary | ICD-10-CM | POA: Insufficient documentation

## 2021-12-14 DIAGNOSIS — F329 Major depressive disorder, single episode, unspecified: Secondary | ICD-10-CM | POA: Insufficient documentation

## 2021-12-14 MED ORDER — BACLOFEN 10 MG PO TABS: 5.0000 mg | ORAL_TABLET | Freq: Four times a day (QID) | ORAL | 5 refills | Status: DC

## 2021-12-14 MED ORDER — DULOXETINE HCL 30 MG PO CPEP: 30.0000 mg | ORAL_CAPSULE | Freq: Every evening | ORAL | 5 refills | Status: DC

## 2021-12-14 NOTE — Patient Instructions (Signed)
Pt is a 56 yr old with generalized dystonia and Parkinsonism- also has panic attacks and anxiety and depression and complex PTSD- from chilhood- - no HTN, DM- here for evaluation.   Suggest carpal tunnel wrists splints B/L- to wear when wrists tight and cramping 2.  Cervical MRI and brain MRI- due to L sided of face and body sensory deficits. Cannot order until has insurance.   3.  Apply for disability- pt CANNOT work with tremors and generalized dystonia affecting her ability to even wipe her bottom to have BM due to dystonia as well as cannot put on bra by self without assistance; can walk- but has frequent near falls/falls against wall- should walk with Cane at minimum- but truly meets criteria for Disability permanently. I am happy to fill out paperwork for you for disability-  CALL LAWYER about disability- give them my paperwork/let them know.   4. If having memory issues, don't want to give Gabapentin- lets go back to Cymbalta-   .  Duloxetine /Cymbalta 30 mg nightly x 1 week (can increase as early as 4-5 days if tolerating)   Then 60 mg nightly- for nerve pain  1% of patients can have nausea with Duloxetine- call me if needs an anti-nausea medicine. Can also cause mild dry mouth/dry eyes and mild constipation. Rare cases can cause urinary retention.   5. Will try baclofen for tightness/dystonia-  5 mg 4x/day for 1 week, then increase to 10 mg 1 tab 4x/day x 1 week, then if needed, increase to 15 mg 1.5 tabs 4x/day- for dystonia- call me if significant side effects- most common side effects are constipation and sleepiness.   6. Will look into Dantrolene after has insurance, as well as possible Trileptal for nerve pain or Lyrica if need be, but Lyrica can make depression worse.  7. Will also wait on botox for now- Dr Marya Fossa was concerned to give her Botox   8.  Called Dr Riley Kill to discuss pt while in room  9. Look into bidet- it goes on toilet to help you clean backside after BM.    10. F/u in 3 months- but call me/my chart in 65month to let me know how things going.   11. Ask front desk about card to help pay for medical bills at [facility].

## 2021-12-14 NOTE — Progress Notes (Signed)
Subjective:    Patient ID: [name], female    DOB: 1966/01/29, 56 y.o.   MRN: [id number]  HPI Pt is a 56 yr old with generalized dystonia and Parkinsonism- also has panic attacks and anxiety and complex PTSD- from chilhood- - no HTN, DM- here for evaluation.    Has been to [facility] Dr [name]- who said "there's nothing she can do"  tried Artane;   Doesn't remember if tried Baclofen-   Deepest burn she's ever felt- feels like swallowed gas.   Tried: Gabapentin- didn't help- tried 100 mg TID-  Lyrica- didn't help Cymbalta- worked for [employer]- lasted a few months.  Baclofen tried in ED- but not really tried.  Zanaflex- tried in 2019- didn't help- but only tried 2 mg BID prn- only had 1 month Rx for it.    Dr [name] wanted to do Botox, but didn't because of tremors. Didn't want to give too much and close throat.  Was told would be in wc and feeding tube.    Cannot sleep due to pain and tremors.  Has frequent near falls, but doesn't fall on floor- just against wall.   Family Hx: father, grandfather and 2 uncles and 3 sisters died with Parkinson's - not clear if was PD, or parkinsonism- and 2 cousins- 1 in hospice for same Dx.    Social Hx: Mother had 5 strokes last year- trying to keep her at home.   Pain Inventory Average Pain 10 Pain Right Now 10 My pain is constant, burning, tingling, and aching  In the last 24 hours, has pain interfered with the following? General activity 9 Relation with others 9 Enjoyment of life 0 What TIME of day is your pain at its worst? morning , daytime, evening, and night Sleep (in general) Poor  Pain is worse with:  cold Pain improves with: heat/ice Relief from Meds:  na  how many minutes can you walk? 15 ability to climb steps?  yes do you drive?  yes  not employed: date last employed . I need assistance with the following:  meal prep and household  duties  tremor tingling spasms dizziness depression anxiety  New pt  New pt    Family History  Problem Relation Age of Onset   Parkinson's disease Father    Stroke Father    Diabetes Mother    Hypertension Mother    Heart attack Maternal Aunt    Heart attack Maternal Uncle    Cancer Maternal Grandmother        ovarian cancer   Heart attack Maternal Grandmother    Cerebrovascular Accident Maternal Grandmother    Heart attack Maternal Grandfather    Cerebrovascular Accident Maternal Grandfather    Stroke Paternal Grandmother    Diabetes Paternal Grandmother    Congestive Heart Failure Paternal Grandmother    Stroke Paternal Grandfather    Parkinson's disease Paternal Grandfather    Heart attack Maternal Uncle    Movement disorder Sister    Parkinson's disease Paternal Aunt        3 daughters also have Parkinsons   Parkinson's disease Paternal Aunt    Parkinson's disease Paternal Aunt    Parkinson's disease Paternal Uncle    Social History   Socioeconomic History   Marital status: Divorced    Spouse name: Not on file   Number of children: 1   Years of education: Not on file   Highest education level: 12th grade  Occupational History   Not  on file  Tobacco Use   Smoking status: Never   Smokeless tobacco: Never  Substance and Sexual Activity   Alcohol use: No    Comment: 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 occ marijuana (1 /mo)   Sexual activity: Not Currently  Other Topics Concern   Not on file  Social History Narrative   Patient is divorced, lives with her mother. She only occasionally drinks caffeine. Exercise is limited.      Right handed   Social Determinants of Health   Financial Resource Strain: Not on file  Food Insecurity: Not on file  Transportation Needs: Not on file  Physical Activity: Not on file  Stress: Not on file  Social Connections: Not on  file   Past Surgical History:  Procedure Laterality Date   ABDOMINAL HYSTERECTOMY  late 1990s   Dr. 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   ADENOIDECTOMY     FOOT FRACTURE SURGERY Right    OVARIAN CYST REMOVAL     Past Medical History:  Diagnosis Date   Anxiety    Depression    Movement disorder    MS (multiple sclerosis) (HCC)    Other specified cardiac arrhythmias 1990s   Panic attacks    Polyneuritis    PTSD (post-traumatic stress disorder)    Tremors of nervous system    BP 134/79   Pulse 62   Ht 5\' 6"  (1.676 m)   Wt 130 lb (59 kg)   SpO2 97%   BMI 20.98 kg/m   Opioid Risk Score:   Fall Risk Score:  `1  Depression screen [facility] 2/9     12/14/2021    9:41 AM 09/07/2021    9:21 AM 07/09/2021    8:48 AM 04/17/2015   11:26 AM  Depression screen PHQ 2/9  Decreased Interest 2 2 3 3   Down, Depressed, Hopeless 3 3 3 2   PHQ - 2 Score 5 5 6 5   Altered sleeping 3 3  3   Tired, decreased energy 3 2  3   Change in appetite 3 2  3   Feeling bad or failure about yourself  2 3  2   Trouble concentrating 3 3  3   Moving slowly or fidgety/restless 3 1  3   Suicidal thoughts 0 0  0  PHQ-9 Score 22 19  22   Difficult doing work/chores Very difficult Very difficult       Review of Systems  Musculoskeletal:  Positive for arthralgias, gait problem, joint swelling, myalgias and neck pain.       Parkinsons  Neurological:  Positive for dizziness and tremors.  Psychiatric/Behavioral:  Positive for dysphoric mood. The patient is nervous/anxious.   All other systems reviewed and are negative.     Objective:   Physical Exam  Awake, alert, constant tremors/shaking; holding L side of neck, head/torso shaking, NAD  Neuro: No hoffman's B/L  Absent sensation to light touch on L face as well as L side of body-   Dystonia notices more on L side in L shoulder, some L elbow and esp L wrist and fingers/hand  MS:  Tight/almost  agonal tightness of Upper traps L>>R; levators, rhomboids, and less so in pecs all L>>>R.  Cannot unclench L fist- - when unfurl L fist, takes moderate to severe amount of strength to do so.  Muscles  also tight in L calf and curling L toes- but cannot move spontaneously.  Cognitively- Took awhile to get her to understand to test triceps not biceps-   RUE 5-/5 in biceps, triceps, WE, grip and FA LUE- 4-/5 in biceps and triceps, WE 4-/5; and grip 3/5; and cannot do FA due to fist dystonia.  RLE_ 5-5/ in HF, KE, KF, DF and PF LLE- HF 4-/5; KE/KF 4-/5; and DF and PF 4-/5  Psychiatric- tearful throughout visit.     Assessment & Plan:   Pt is a 56 yr old with generalized dystonia and Parkinsonism- also has panic attacks and anxiety and depression and complex PTSD- from chilhood- - no HTN, DM- here for evaluation.   Suggest carpal tunnel wrists splints B/L- to wear when wrists tight and cramping 2.  Cervical MRI and brain MRI- due to L sided of face and body sensory deficits. Cannot order until has insurance.   3.  Apply for disability- pt CANNOT work with tremors and generalized dystonia affecting her ability to even wipe her bottom to have BM due to dystonia as well as cannot put on bra by self without assistance; can walk- but has frequent near falls/falls against wall- should walk with Cane at minimum- but truly meets criteria for Disability permanently. I am happy to fill out paperwork for you for disability-  CALL LAWYER about disability- give them my paperwork/let them know.   4. If having memory issues, don't want to give Gabapentin- lets go back to Cymbalta-   .  Duloxetine /Cymbalta 30 mg nightly x 1 week (can increase as early as 4-5 days if tolerating)   Then 60 mg nightly- for nerve pain  1% of patients can have nausea with Duloxetine- call me if needs an anti-nausea medicine. Can also cause mild dry mouth/dry eyes and mild constipation. Rare cases can cause urinary retention.   5.  Will try baclofen for tightness/dystonia-  5 mg 4x/day for 1 week, then increase to 10 mg 1 tab 4x/day x 1 week, then if needed, increase to 15 mg 1.5 tabs 4x/day- for dystonia- call me if significant side effects- most common side effects are constipation and sleepiness.   6. Will look into Dantrolene after has insurance, as well as possible Trileptal for nerve pain or Lyrica if need be, but Lyrica can make depression worse.  7. Will also wait on botox for now- Dr [name] was concerned to give her Botox   8.  Called Dr Naaman Plummer to discuss pt while in room  9. Look into bidet- it goes on toilet to help you clean backside after BM.   10. F/u in 3 months- but call me/my chart in 18month to let me know how things going.   11. Ask front desk about card to help pay for medical bills at [facility].   I spent a total of  47  minutes on total care today- >50% coordination of care- due to discussion of disability, dystonia generalized and tremors.

## 2021-12-21 ENCOUNTER — Telehealth: Payer: Self-pay | Admitting: Physical Medicine and Rehabilitation

## 2021-12-21 MED ORDER — ONDANSETRON HCL 4 MG PO TABS: 4.0000 mg | ORAL_TABLET | Freq: Three times a day (TID) | ORAL | 0 refills | Status: DC | PRN

## 2021-12-21 NOTE — Telephone Encounter (Signed)
Patient requesting medication to help with nausea given to current medications taken

## 2021-12-21 NOTE — Telephone Encounter (Signed)
Notified sent in 

## 2022-01-03 ENCOUNTER — Other Ambulatory Visit: Payer: Self-pay | Admitting: Physician Assistant

## 2022-01-03 ENCOUNTER — Other Ambulatory Visit: Payer: Self-pay

## 2022-01-03 MED ORDER — SERTRALINE HCL 25 MG PO TABS: 25.0000 mg | ORAL_TABLET | Freq: Every day | ORAL | 3 refills | Status: DC

## 2022-01-03 MED ORDER — CLONAZEPAM 0.5 MG PO TABS: 0.5000 mg | ORAL_TABLET | Freq: Two times a day (BID) | ORAL | 2 refills | Status: DC | PRN

## 2022-01-03 NOTE — Telephone Encounter (Signed)
Zoloft sent to [facility] pharmacy in [place], needing Clonazepam refill sent to [facility] for patient.

## 2022-01-03 NOTE — Telephone Encounter (Signed)
   LAST APPOINTMENT DATE: 09/24/21  NEXT APPOINTMENT DATE: None  MEDICATION: sertraline (ZOLOFT) 25 MG tablet  clonazePAM (KLONOPIN) 0.5 MG tablet   Is the patient out of medication? Will run out on 01/07/22  PHARMACY: [facility] [address], [place] - [address], [place] [address] Phone: [phone]  Fax: [phone]

## 2022-01-17 ENCOUNTER — Encounter: Payer: Self-pay | Admitting: *Deleted

## 2022-03-15 ENCOUNTER — Encounter: Payer: Self-pay | Admitting: Physical Medicine and Rehabilitation

## 2022-03-15 ENCOUNTER — Encounter
Payer: Medicaid Other | Attending: Physical Medicine and Rehabilitation | Admitting: Physical Medicine and Rehabilitation

## 2022-03-15 VITALS — BP 128/85 | HR 76 | Ht 66.0 in | Wt 127.2 lb

## 2022-03-15 DIAGNOSIS — R251 Tremor, unspecified: Secondary | ICD-10-CM

## 2022-03-15 DIAGNOSIS — F329 Major depressive disorder, single episode, unspecified: Secondary | ICD-10-CM | POA: Diagnosis not present

## 2022-03-15 DIAGNOSIS — G20C Parkinsonism, unspecified: Secondary | ICD-10-CM | POA: Diagnosis present

## 2022-03-15 DIAGNOSIS — G249 Dystonia, unspecified: Secondary | ICD-10-CM | POA: Diagnosis not present

## 2022-03-15 MED ORDER — DANTROLENE SODIUM 50 MG PO CAPS
50.0000 mg | ORAL_CAPSULE | Freq: Every day | ORAL | 5 refills | Status: DC
Start: 2022-03-15 — End: 2022-04-11

## 2022-03-15 MED ORDER — LEVETIRACETAM 250 MG PO TABS: 250.0000 mg | ORAL_TABLET | Freq: Two times a day (BID) | ORAL | 5 refills | Status: DC

## 2022-03-15 MED ORDER — PROMETHAZINE HCL 12.5 MG PO TABS: 12.5000 mg | ORAL_TABLET | Freq: Four times a day (QID) | ORAL | 1 refills | Status: DC | PRN

## 2022-03-15 NOTE — Patient Instructions (Signed)
Pt is a 57 yr old with generalized dystonia and Parkinsonism- also has panic attacks and anxiety and complex PTSD- from chilhood- - no HTN, DM- here for f/u- on dystonia.   Keppra- Levicetram- 250 mg 2x/day x 1 week  then 500 mg 2x/day- for dystonia and nerve pain It can cause irritability in 3% of people- if you get that, get Vitamin B complex over the counter- it helps at least 50% of people.   2. Take Baclofen 1/2 tab 3x/day x 3 days, then stop- can do immediately.    3. Then, start the wean off Cymbalta- Decrease Duloxetine/Cymbalta to 30 mg nightly x 1 week, then stop it.    4. Give phenergan 12.5 mg q6 hours as needed for now- #30- 1 refill.   5. In 2 weeks, add Dantrolene-  If knocks you on your butt- don't take another pill; if mild sleepiness- usually gets used ot it.   50 mg nightly x 1 week, then 50 mg 2x/day x 1 week;  then 50 mg in AM and 100 mg at night x 1 week,  then 100 mg 2x/day. For dystonia - no other meds help dystonia other than Keppra and Dantrolene- so needs them both    6. Will try to get Dystonia under better control then determine if has Fibromyalgia.   7. Can get bidet to go on toilet to help clean self after BM.   8.  If they will send me paperwork, will be happy to fil it out.   9. The Muscle Hook- [employer] or [employer]- yoga section- hold pressure x 2-4 minutes on each muscle to get it to relax-   10. Try to do trigger point injections next appointment for relaxation. .   11.  F/U in 3 months- trigger point injections and f/u on dystonia.

## 2022-03-15 NOTE — Progress Notes (Signed)
Subjective:    Patient ID: [name], female    DOB: 1965/12/10, 57 y.o.   MRN: [id number]  HPI  Pt is a 57 yr old with generalized dystonia and Parkinsonism- also has panic attacks and anxiety and complex PTSD- from chilhood- - no HTN, DM- here for f/u- on dystonia.   Too sleepy with Baclofen- taking 10 mg 3xday- couldn't increase more.  Made Depression worse and feels like not all here.  "Kicking her tail"    One day had no pain at all- slept so hard that night, couldn't get to blanket beside her, so sedated.   Has cotton mouth from heXX.   Still taking Duloxetine- still nauseated- even on Cymbalta after 3 months.  Burning is the same- burning SO bad back in muscles of back.    Has Medicaid now- since December 2023- [employer] reimbursed her for medications.  Has dental, vision, and medical.   Disability- I haven't received anything from them yet to me;  Wants her to get Psychiatric evaluation. Per Disability.   Hadn't been able ot get bidet to clean self on toilet so far.   R hip /low back really bothering her.  Feels like going out of joint.  Cannot sleep on sides due to L shoulder and R hip- and hard to sleep on back due to neck pain.  Doesn't take anything for sleep.    Hasn't been able to see PCP due to lack of insurance.    Pain Inventory Average Pain 8 Pain Right Now 8 My pain is burning, tingling, and aching  In the last 24 hours, has pain interfered with the following? General activity 9 Relation with others 9 Enjoyment of life 10 What TIME of day is your pain at its worst? night Sleep (in general) Poor  Pain is worse with: walking, bending, sitting, and standing Pain improves with: medication Relief from Meds: 5  Family History  Problem Relation Age of Onset   Parkinson's disease Father    Stroke Father    Diabetes Mother    Hypertension Mother    Heart attack Maternal Aunt    Heart attack Maternal Uncle    Cancer Maternal Grandmother         ovarian cancer   Heart attack Maternal Grandmother    Cerebrovascular Accident Maternal Grandmother    Heart attack Maternal Grandfather    Cerebrovascular Accident Maternal Grandfather    Stroke Paternal Grandmother    Diabetes Paternal Grandmother    Congestive Heart Failure Paternal Grandmother    Stroke Paternal Grandfather    Parkinson's disease Paternal Grandfather    Heart attack Maternal Uncle    Movement disorder Sister    Parkinson's disease Paternal Aunt        3 daughters also have Parkinsons   Parkinson's disease Paternal Aunt    Parkinson's disease Paternal Aunt    Parkinson's disease Paternal Uncle    Social History   Socioeconomic History   Marital status: Divorced    Spouse name: Not on file   Number of children: 1   Years of education: Not on file   Highest education level: 12th grade  Occupational History   Not on file  Tobacco Use   Smoking status: Never   Smokeless tobacco: Never  Substance and Sexual Activity   Alcohol use: No    Comment: previous heavy drinker. quit in the 80s   Drug use: Yes    Types: Cocaine, Marijuana    Comment: no  cocain since the 80's. occ marijuana (1 /mo)   Sexual activity: Not Currently  Other Topics Concern   Not on file  Social History Narrative   Patient is divorced, lives with her mother. She only occasionally drinks caffeine. Exercise is limited.      Right handed   Social Determinants of Health   Financial Resource Strain: Not on file  Food Insecurity: Not on file  Transportation Needs: Not on file  Physical Activity: Not on file  Stress: Not on file  Social Connections: Not on file   Past Surgical History:  Procedure Laterality Date   ABDOMINAL HYSTERECTOMY  late 1990s   Dr. 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   ADENOIDECTOMY     FOOT FRACTURE SURGERY Right    OVARIAN CYST REMOVAL     Past Surgical History:  Procedure  Laterality Date   ABDOMINAL HYSTERECTOMY  late 1990s   Dr. 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   ADENOIDECTOMY     FOOT FRACTURE SURGERY Right    OVARIAN CYST REMOVAL     Past Medical History:  Diagnosis Date   Anxiety    Depression    Movement disorder    MS (multiple sclerosis) (HCC)    Other specified cardiac arrhythmias 1990s   Panic attacks    Polyneuritis    PTSD (post-traumatic stress disorder)    Tremors of nervous system    Ht 5' 6"$  (1.676 m)   BMI 20.98 kg/m   Opioid Risk Score:   Fall Risk Score:  `1  Depression screen [facility] 2/9     12/14/2021    9:41 AM 09/07/2021    9:21 AM 07/09/2021    8:48 AM 04/17/2015   11:26 AM  Depression screen PHQ 2/9  Decreased Interest 2 2 3 3  $ Down, Depressed, Hopeless 3 3 3 2  $ PHQ - 2 Score 5 5 6 5  $ Altered sleeping 3 3  3  $ Tired, decreased energy 3 2  3  $ Change in appetite 3 2  3  $ Feeling bad or failure about yourself  2 3  2  $ Trouble concentrating 3 3  3  $ Moving slowly or fidgety/restless 3 1  3  $ Suicidal thoughts 0 0  0  PHQ-9 Score 22 19  22  $ Difficult doing work/chores Very difficult Very difficult        Review of Systems  Musculoskeletal:  Positive for gait problem.       B/L hand pain   All other systems reviewed and are negative.      Objective:   Physical Exam  Awake, alert, appropriate, tearful at times, NAD Wearing L wrist splints Less tightness in arms>legs-  Myofascial/trigger points upper traps, levators, and scalenes as well as rhomboids. Due to dystonia      Assessment & Plan:   Pt is a 57 yr old with generalized dystonia and Parkinsonism- also has panic attacks and anxiety and complex PTSD- from chilhood- - no HTN, DM- here for f/u- on dystonia.   Keppra- Levicetram- 250 mg 2x/day x 1 week  then 500 mg 2x/day- for dystonia and nerve pain It can cause irritability in 3% of people- if you get that, get Vitamin B complex over  the counter- it helps at least 50% of people.   2. Take Baclofen 1/2 tab 3x/day x 3 days, then stop- can do  immediately.    3. Then, start the wean off Cymbalta- Decrease Duloxetine/Cymbalta to 30 mg nightly x 1 week, then stop it.    4. Give phenergan 12.5 mg q6 hours as needed for now- #30- 1 refill.   5. In 2 weeks, add Dantrolene-  If knocks you on your butt- don't take another pill; if mild sleepiness- usually gets used ot it.   50 mg nightly x 1 week, then 50 mg 2x/day x 1 week;  then 50 mg in AM and 100 mg at night x 1 week,  then 100 mg 2x/day. For dystonia - no other meds help dystonia other than Keppra and Dantrolene- so needs them both    6. Will try to get Dystonia under better control then determine if has Fibromyalgia.   7. Can get bidet to go on toilet to help clean self after BM.   8.  If they will send me paperwork, will be happy to fil it out.   9. The [employer]- [employer] or [employer]- yoga section- hold pressure x 2-4 minutes on each muscle to get it to relax-   10. Try to do trigger point injections next appointment for relaxation. .   11.  F/U in 3 months- trigger point injections and f/u on dystonia.    I spent a total of  34  minutes on total care today- >50% coordination of care- due to education as detailed above- esp on the muscle hook.

## 2022-03-18 ENCOUNTER — Telehealth: Payer: Self-pay

## 2022-03-18 NOTE — Telephone Encounter (Signed)
03/18/2022-PA for Dantrium 50 MG submitted. Ref# [name].

## 2022-03-19 NOTE — Telephone Encounter (Signed)
Dantrolene Cap 50 MG approved through 03/19/2023.

## 2022-03-19 NOTE — Telephone Encounter (Signed)
Approval faxed to the pharmacy.

## 2022-04-11 ENCOUNTER — Telehealth: Payer: Self-pay | Admitting: Physician Assistant

## 2022-04-11 ENCOUNTER — Ambulatory Visit (INDEPENDENT_AMBULATORY_CARE_PROVIDER_SITE_OTHER): Payer: Medicaid Other | Admitting: Family

## 2022-04-11 ENCOUNTER — Encounter: Payer: Self-pay | Admitting: Family

## 2022-04-11 VITALS — BP 138/82 | HR 72 | Temp 98.2°F | Resp 16 | Ht 66.0 in | Wt 130.0 lb

## 2022-04-11 DIAGNOSIS — Z1231 Encounter for screening mammogram for malignant neoplasm of breast: Secondary | ICD-10-CM | POA: Diagnosis not present

## 2022-04-11 DIAGNOSIS — F064 Anxiety disorder due to known physiological condition: Secondary | ICD-10-CM | POA: Diagnosis not present

## 2022-04-11 DIAGNOSIS — R079 Chest pain, unspecified: Secondary | ICD-10-CM | POA: Diagnosis not present

## 2022-04-11 MED ORDER — CLONAZEPAM 0.5 MG PO TABS: 0.5000 mg | ORAL_TABLET | Freq: Two times a day (BID) | ORAL | 2 refills | Status: DC | PRN

## 2022-04-11 NOTE — Progress Notes (Signed)
Patient ID: [name], female    DOB: 05/25/1965, 57 y.o.   MRN: [id number]  Chief Complaint  Patient presents with   Chest Pain    Random episodes of chest pain lest side of chest lasting 5 secs, ongoing for the past week   HPI:      Chest pain:  seen by pain mgt on 2/9 for her pain r/t dystonia & Parkinson's, she describes having a burning pain from her head down to her toes all the time. She was advised to wean Baclofen and Cymbalta (due to side effects), & started on Dantrolene & Keppra, titrating up gradually. She reports the meds caused her to feel too drowsy so she stopped taking them. Reports having sharp pains on left side of chest that last 4-5 seconds starting about a week ago, occasionally with mild SOB & sometimes pain goes down her left arm. Denies any dizziness, jaw pain, sweating, or headache.   Anxiety:   pt having more anxiety than usual due to her chronic condition & pain. 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  Assessment & Plan:  1. Chest pain, unspecified type - ECG wnl. pt reassured. Believe pain mostly related to weaning off of Baclofen & Cymbalta a few weeks ago and then starting Dantrolene & Keppra via pain mgt, but only could tolerate a few days and then stopped taking altogether. Advised pt to call  - EKG 12-Lead  2. Screening mammogram for breast cancer -  - MM DIGITAL SCREENING BILATERAL; Future  3. Anxiety disorder due to general medical condition - refilling Klonopin. Advised ok to take qd or bid for now until can get pain meds sorted as it is increasing her anxiety and other symptoms r/t anxiety. Pt requested options for natural or homeopathic providers to help her condition, offered a couple of options, though advised she may have to pay out of  pocket.  - clonazePAM (KLONOPIN) 0.5 MG tablet; Take 1 tablet (0.5 mg total) by mouth 2 (two) times daily as needed.  Dispense: 60 tablet; Refill: 2   Subjective:    Outpatient Medications Prior to Visit  Medication Sig Dispense Refill   ondansetron (ZOFRAN) 4 MG tablet Take 1 tablet (4 mg total) by mouth every 8 (eight) hours as needed for nausea or vomiting. 90 tablet 0   promethazine (PHENERGAN) 12.5 MG tablet Take 1 tablet (12.5 mg total) by mouth every 6 (six) hours as needed for nausea or vomiting. 30 tablet 1   sertraline (ZOLOFT) 25 MG tablet Take 1 tablet (25 mg total) by mouth daily. 30 tablet 3   baclofen (LIORESAL) 10 MG tablet Take 0.5 tablets (5 mg total) by mouth 4 (four) times daily. X 1 week- then 10 mg/1 tab 4x/day- if need be, after another 1 week, can increase to 15 mg/1.5 tabs 4x/day- for generalized dystonia 180 each 5   dantrolene (DANTRIUM) 50 MG capsule Take 1 capsule (50 mg total) by mouth at bedtime. X 1 week, then BID x 1 week, then 50 mg TID x 1 week, then 100 mg BID- for dystonia- there are NO OTHER meds that treat dystonia than Dantrolene and Keppra. 120 capsule 5   DULoxetine (CYMBALTA) 30 MG capsule Take 1 capsule (30 mg total)  by mouth at bedtime. X 1 week, then 60 mg nightly- for nerve pain can take with low dose Zoloft 60 capsule 5   levETIRAcetam (KEPPRA) 250 MG tablet Take 1 tablet (250 mg total) by mouth 2 (two) times daily. X 1 week; then 500 mg BID- 120 tablet 5   clonazePAM (KLONOPIN) 0.5 MG tablet Take 1 tablet (0.5 mg total) by mouth 2 (two) times daily as needed. 60 tablet 2   No facility-administered medications prior to visit.   Past Medical History:  Diagnosis Date   Anxiety    Depression    Movement disorder    MS (multiple sclerosis) ([place])    Other specified cardiac arrhythmias 1990s   Panic attacks    Polyneuritis    PTSD (post-traumatic stress disorder)    Tremors of nervous system    Past Surgical History:  Procedure Laterality  Date   ABDOMINAL HYSTERECTOMY  late 1990s   Dr. 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   ADENOIDECTOMY     FOOT FRACTURE SURGERY Right    OVARIAN CYST REMOVAL     Allergies  Allergen Reactions   Penicillins Anaphylaxis   Contrast Media [Iodinated Contrast Media] Other (See Comments)    unknown   Sulfa Antibiotics Rash      Objective:    Physical Exam Vitals and nursing note reviewed.  Constitutional:      Appearance: Normal appearance. She is normal weight. She is not toxic-appearing.  HENT:     Head: Normocephalic and atraumatic.  Eyes:     Extraocular Movements: Extraocular movements intact.     Conjunctiva/sclera: Conjunctivae normal.     Pupils: Pupils are equal, round, and reactive to light.  Cardiovascular:     Rate and Rhythm: Normal rate and regular rhythm.     Pulses: Normal pulses.     Heart sounds: Normal heart sounds.  Pulmonary:     Effort: Pulmonary effort is normal.     Breath sounds: Normal breath sounds.  Musculoskeletal:        General: Normal range of motion.  Skin:    General: Skin is warm and dry.  Neurological:     General: No focal deficit present.     Mental Status: She is alert and oriented to person, place, and time.     Comments: Tremors present Involuntary neck movements present  Psychiatric:        Mood and Affect: Mood normal. Affect is tearful.        Behavior: Behavior normal.        Thought Content: Thought content normal.    BP 138/82   Pulse 72   Temp 98.2 F (36.8 C) (Temporal)   Resp 16   Ht '5\' 6"'$  (1.676 m)   Wt 130 lb (59 kg)   SpO2 99%   BMI 20.98 kg/m  Wt Readings from Last 3 Encounters:  04/11/22 130 lb (59 kg)  03/15/22 127 lb 3.2 oz (57.7 kg)  12/14/21 130 lb (59 kg)      Jeanie Sewer, NP

## 2022-04-11 NOTE — Patient Instructions (Signed)
It was very nice to see you today!   Your ECG is normal, I believe the pain you are feeling in your chest is related to being off your muscle relaxants.  Call Dr. Lorelee Market and let her office know you have stopped the Dantrolene & Keppra due to the side effects and get their advice.  I have sent your Mammogram order as discussed.  For a natural, homeopathic, or integrative type of care, you can contact [facility] in [place] or [facility] primary care here in [place] at [phone].  Neither of these will accept Medicaid I feel sure, but they may set up payment plan with you. Go online and research their websites to see if you think they may can help.   PLEASE NOTE:  If you had any lab tests please let us know if you have not heard back within a few days. You may see your results on MyChart before we have a chance to review them but we will give you a call once they are reviewed by [place]. If we ordered any referrals today, please let us know if you have not heard from their office within the next week.

## 2022-04-11 NOTE — Telephone Encounter (Signed)
Patient states she has been experiencing a sharp pain in the left side of her chest for the last couple of days that makes her real short of breath.   States feels her heart is beating to fast. States left arm and left shoulder feels tingly/painful that has been going on for a long time.  Transferred to Triage Shanon Brow).

## 2022-04-11 NOTE — Telephone Encounter (Signed)
Patient cold transferred from triage nurse. States informed to see PCP within 24 hours. Due to no availabilities, pt has been scheduled with Jeanie Sewer @ 10:40 am on 04/11/22.

## 2022-04-11 NOTE — Telephone Encounter (Signed)
Final Disposition: See PCP within 24 hours.   Patient Name: [name] Gender: Female DOB: 1965/08/10 Age: 57 Y 63 M 5 D Return Phone Number: [phone] (Primary) Address: City/ State/ Zip: [place] [address] [facility] [place] [facility] Paediatric nurse, Freight forwarder- PA Contact Type Call Who Is Calling Patient / Member / Family / Caregiver Call Type Triage / Clinical Relationship To Patient Self Return Phone Number [phone] (Primary) Chief Complaint CHEST PAIN - pain, pressure, heaviness or tightness Reason for Call Symptomatic / Request for Health Information Initial Comment Has sharp pain in left side of chest for past few days, has shortness of breath, heart palpitations, left arm/should has tingling and painful been going on for a long time. Translation No Nurse Assessment Nurse: Gildardo Pounds, RN, Amy Date/Time (Eastern Time): 04/11/2022 8:56:08 AM Confirm and document reason for call. If symptomatic, describe symptoms. ---Caller states she has had sharp pain in the left side of her chest the past few days. She has shortness of breath with exertion, heart palpitations, & her left arm/ shoulder has tingling and pain. She has Parkinsons & Dystonia, so that has been going on for a long time. She hurts & has tingling all the time. Can't tell if it is from that or something. She had trouble with her heart about 30 yrs. ago. She had palpitations & had to be hospitalized for a couple days. They put her on Metoprolol. She has had lightheadedness & SOB while driving. She had to pull over & let her husband drive. If she bends down, she get real whoozy. The last time she had CP was last night. It was a sharp shooting pain that lasts 2-3 seconds. Lump in left breast. Dr. Franco Nones about that. Couldn't get mammogram b/c she lost her insurance. She has Medicaid now. Had pt. take BP B/ c she  said she was having the palpitations right now. BP is 163/98 & HR is 83. Pt. states she does have hx of anxiety & is nervous. Does the patient have any new or worsening symptoms? ---Yes Will a triage be completed? ---Yes Related visit to physician within the last 2 weeks? ---No PLEASE NOTE: All timestamps contained within this report are represented as [place] Standard Time. CONFIDENTIALTY NOTICE: This fax transmission is intended only for the addressee. It contains information that is legally privileged, confidential or otherwise protected from use or disclosure. If you are not the intended recipient, you are strictly prohibited from reviewing, disclosing, copying using or disseminating any of this information or taking any action in reliance on or regarding this information. If you have received this fax in error, please notify us immediately by telephone so that we can arrange for its return to [place]. Phone: [phone], Toll-Free: [phone], Fax: [phone] Page: 2 of 2 Call Id: [id number] Nurse Assessment Does the PT have any chronic conditions? (i.e. diabetes, asthma, this includes High risk factors for pregnancy, etc.) ---Yes List chronic conditions. ---Parkinsons, Dystonia, Pain clinic pt- dr. is going to test her for neuropathy & Fibromyalgia, MS, panic attacks Is this a behavioral health or substance abuse call? ---No Guidelines Guideline Title Affirmed Question Affirmed Notes Nurse Date/Time Eilene Ghazi Time) Chest Pain [1] Chest pain lasts < 5 minutes AND [2] NO chest pain or cardiac symptoms (e.g., breathing difficulty, sweating) now (Exception: Chest pains that last only a few seconds.) Lovelace, RN, Amy 04/11/2022 8:58:55 AM Disp. Time Eilene Ghazi Time) Disposition Final  User 04/11/2022 8:54:05 AM Send to Urgent [facility] 04/11/2022 9:06:23 AM See PCP within 24 Hours Yes Lovelace, RN, Amy Final Disposition 04/11/2022 9:06:23 AM See PCP within 24 Hours Yes  Lovelace, RN, Amy Caller Disagree/Comply Comply Caller Understands Yes PreDisposition InappropriateToAsk Care Advice Given Per Guideline SEE PCP WITHIN 24 HOURS: * IF OFFICE WILL BE OPEN: You need to be examined within the next 24 hours. Call your doctor (or NP/PA) when the office opens and make an appointment. CALL BACK IF: * Difficulty breathing occurs * Chest pain lasts over 5 minutes * You become worse CARE ADVICE given per Chest Pain (Adult) guideline. Referrals REFERRED TO PCP OFFICE

## 2022-04-11 NOTE — Telephone Encounter (Signed)
Please see pt triage note and advise

## 2022-05-24 ENCOUNTER — Encounter: Payer: Self-pay | Admitting: Physician Assistant

## 2022-05-24 ENCOUNTER — Ambulatory Visit: Payer: Medicaid Other

## 2022-05-24 ENCOUNTER — Ambulatory Visit (INDEPENDENT_AMBULATORY_CARE_PROVIDER_SITE_OTHER): Payer: Medicaid Other | Admitting: Physician Assistant

## 2022-05-24 VITALS — BP 120/82 | HR 58 | Ht 66.0 in | Wt 138.6 lb

## 2022-05-24 DIAGNOSIS — F064 Anxiety disorder due to known physiological condition: Secondary | ICD-10-CM | POA: Diagnosis not present

## 2022-05-24 DIAGNOSIS — G20C Parkinsonism, unspecified: Secondary | ICD-10-CM | POA: Diagnosis not present

## 2022-05-24 DIAGNOSIS — G249 Dystonia, unspecified: Secondary | ICD-10-CM | POA: Diagnosis not present

## 2022-05-24 DIAGNOSIS — F4310 Post-traumatic stress disorder, unspecified: Secondary | ICD-10-CM

## 2022-05-24 MED ORDER — SERTRALINE HCL 50 MG PO TABS: 50.0000 mg | ORAL_TABLET | Freq: Every day | ORAL | 1 refills | Status: DC

## 2022-05-24 MED ORDER — CLONAZEPAM 1 MG PO TABS: 1.0000 mg | ORAL_TABLET | Freq: Two times a day (BID) | ORAL | 2 refills | Status: AC

## 2022-05-24 NOTE — Progress Notes (Unsigned)
Subjective:    Patient ID: [name], female    DOB: 1965-12-30, 57 y.o.   MRN: [id number]  Chief Complaint  Patient presents with   ANXIETY/DEPRESSION    Continues to struggle with depression and anxiety. Physical pain contributes to emotional stress.     HPI Patient is in today for concerns about anxiety and depression. She has not been able to see me recently because of lack of insurance, now has [employer]. Last visit with Dr. Berline Chough on 03/15/22. Starting the process to apply disability  - has seen physical and psych evals so far with disability doctors.   She is completely off Cymbalta.   She is losing hope significantly due to progressive decline in her health condition.  The other day she couldn't drive over a bridge because of her severe PTSD, had to drive 40 minutes to go the other way.  Still trying to care for her mom as well.   Today has mammogram scheduled, but it's on the 4th floor, and severe panic with elevators, states she can't go and have this done.   Currently taking Klonopin 0.5 mg three times daily as needed for panic. She is also on Zoloft 25 mg for anxiety and depression; was told she can't go up on this due to interaction with another medication she's taking.   Struggles to get dressed, cannot have any tops or pants with buttons. Can't fix her hair the way she'd like, had to cut her hair. Cannot open tops on bottles. Can't fasten her bra.   She's starting to have issues swallowing, food getting stuck in her throat. Only doing sandwiches and salads.  Right hip starting to feel like it keeps coming in and out of the joint.  Past Medical History:  Diagnosis Date   Anxiety    Depression    Movement disorder    MS (multiple sclerosis)    Other specified cardiac arrhythmias 1990s   Panic attacks    Polyneuritis    PTSD (post-traumatic stress disorder)    Tremors of nervous system     Past Surgical History:  Procedure Laterality Date    ABDOMINAL HYSTERECTOMY  late 1990s   Dr. 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   ADENOIDECTOMY     FOOT FRACTURE SURGERY Right    OVARIAN CYST REMOVAL      Family History  Problem Relation Age of Onset   Parkinson's disease Father    Stroke Father    Diabetes Mother    Hypertension Mother    Heart attack Maternal Aunt    Heart attack Maternal Uncle    Cancer Maternal Grandmother        ovarian cancer   Heart attack Maternal Grandmother    Cerebrovascular Accident Maternal Grandmother    Heart attack Maternal Grandfather    Cerebrovascular Accident Maternal Grandfather    Stroke Paternal Grandmother    Diabetes Paternal Grandmother    Congestive Heart Failure Paternal Grandmother    Stroke Paternal Grandfather    Parkinson's disease Paternal Grandfather    Heart attack Maternal Uncle    Movement disorder Sister    Parkinson's disease Paternal Aunt        3 daughters also have Parkinsons   Parkinson's disease Paternal Aunt    Parkinson's disease Paternal Aunt    Parkinson's disease Paternal Uncle     Social History   Tobacco Use   Smoking status:  Never   Smokeless tobacco: Never  Substance Use Topics   Alcohol use: No    Comment: 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 occ marijuana (1 /mo)     Allergies  Allergen Reactions   Penicillins Anaphylaxis   Contrast Media [Iodinated Contrast Media] Other (See Comments)    unknown   Sulfa Antibiotics Rash    Review of Systems NEGATIVE UNLESS OTHERWISE INDICATED IN HPI      Objective:     BP 120/82   Pulse (!) 58   Ht  (1.676 m)   Wt 138 lb 9.6 oz (62.9 kg)   SpO2 97%   BMI 22.37 kg/m   Wt Readings from Last 3 Encounters:  05/24/22 138 lb 9.6 oz (62.9 kg)  04/11/22 130 lb (59 kg)  03/15/22 127 lb 3.2 oz (57.7 kg)    BP Readings from Last 3  Encounters:  05/24/22 120/82  04/11/22 138/82  03/15/22 128/85     Physical Exam Vitals and nursing note reviewed.  Constitutional:      Appearance: Normal appearance. She is normal weight. She is not toxic-appearing.  HENT:     Head: Normocephalic and atraumatic.     Right Ear: External ear normal.     Left Ear: External ear normal.     Nose: Nose normal.     Mouth/Throat:     Mouth: Mucous membranes are moist.  Eyes:     Extraocular Movements: Extraocular movements intact.     Conjunctiva/sclera: Conjunctivae normal.     Pupils: Pupils are equal, round, and reactive to light.  Cardiovascular:     Rate and Rhythm: Normal rate and regular rhythm.     Pulses: Normal pulses.     Heart sounds: Normal heart sounds.  Pulmonary:     Effort: Pulmonary effort is normal.     Breath sounds: Normal breath sounds.  Musculoskeletal:     Right lower leg: No edema.     Left lower leg: No edema.     Comments: Wearing brace R wrist  Skin:    General: Skin is warm and dry.  Neurological:     General: No focal deficit present.     Mental Status: She is alert and oriented to person, place, and time.     Comments: Tremors present Involuntary neck movements present  Psychiatric:        Mood and Affect: Mood is depressed. Affect is tearful.        Behavior: Behavior normal.        Thought Content: Thought content normal.        Assessment & Plan:  Anxiety disorder due to general medical condition Assessment & Plan: Worsening as described in HPI. Denies any SI.  She is very nervous / hesitant about medicine overall Will increase Zoloft to 50 mg daily. She has Klonopin that she takes one to two times daily as needed; encouraged her to increase this dose to Klonopin 1 mg BID, scheduled.  PDMP reviewed today, no red flags, filling appropriately.  She needs to consider counseling. Referral to social work placed today as well.    Orders: -     Sertraline HCl; Take 1 tablet (50 mg total)  by mouth at bedtime.  Dispense: 90 tablet; Refill: 1 -     clonazePAM; Take 1 tablet (1 mg total) by mouth 2 (two) times  daily.  Dispense: 60 tablet; Refill: 2 -     Ambulatory referral to Social Work  PTSD (post-traumatic stress disorder) Assessment & Plan: Worsening as described in HPI. Denies any SI.  She is very nervous / hesitant about medicine overall Will increase Zoloft to 50 mg daily. She has Klonopin that she takes one to two times daily as needed; encouraged her to increase this dose to Klonopin 1 mg BID, scheduled.  PDMP reviewed today, no red flags, filling appropriately.  She needs to consider counseling. Referral to social work placed today as well.   Orders: -     Sertraline HCl; Take 1 tablet (50 mg total) by mouth at bedtime.  Dispense: 90 tablet; Refill: 1 -     clonazePAM; Take 1 tablet (1 mg total) by mouth 2 (two) times daily.  Dispense: 60 tablet; Refill: 2 -     Ambulatory referral to Social Work  Dystonia Assessment & Plan: Follows with Dr. Berline Chough  Orders: -     Ambulatory referral to Social Work  Primary parkinsonism Assessment & Plan: Follows with Dr. Berline Chough   Orders: -     Ambulatory referral to Social Work   Time Spent: 41 minutes of total time was spent on the date of the encounter performing the following actions: chart review prior to seeing the patient, obtaining history, performing a medically necessary exam, counseling on the treatment plan, placing orders, and documenting in our EHR.      Return in about 4 weeks (around 06/21/2022) for recheck/follow-up / virtually .    Maureen Delatte M Arnol Mcgibbon, [name]

## 2022-05-26 DIAGNOSIS — F064 Anxiety disorder due to known physiological condition: Secondary | ICD-10-CM | POA: Insufficient documentation

## 2022-05-26 DIAGNOSIS — F4310 Post-traumatic stress disorder, unspecified: Secondary | ICD-10-CM | POA: Insufficient documentation

## 2022-05-26 NOTE — Assessment & Plan Note (Signed)
Worsening as described in HPI. Denies any SI.  She is very nervous / hesitant about medicine overall Will increase Zoloft to 50 mg daily. She has Klonopin that she takes one to two times daily as needed; encouraged her to increase this dose to Klonopin 1 mg BID, scheduled.  PDMP reviewed today, no red flags, filling appropriately.  She needs to consider counseling. Referral to social work placed today as well.

## 2022-05-26 NOTE — Patient Instructions (Addendum)
Increase Zoloft to 50 mg daily  Refilled Klonopin - take only as directed  Referrals to help with ADLs placed  If you develop suicidal thoughts, please tell someone and immediately proceed to our local 24/7 crisis center, [facility].[address], [place], [place] [phone].

## 2022-05-26 NOTE — Assessment & Plan Note (Signed)
Follows with Dr. [name]  

## 2022-05-26 NOTE — Assessment & Plan Note (Signed)
Follows with Dr. Berline Chough

## 2022-05-26 NOTE — Assessment & Plan Note (Signed)
Worsening as described in HPI. Denies any SI.  She is very nervous / hesitant about medicine overall Will increase Zoloft to 50 mg daily. She has Klonopin that she takes one to two times daily as needed; encouraged her to increase this dose to Klonopin 1 mg BID, scheduled.  PDMP reviewed today, no red flags, filling appropriately.  She needs to consider counseling. Referral to social work placed today as well.   

## 2022-06-03 ENCOUNTER — Other Ambulatory Visit: Payer: Medicaid Other | Admitting: Licensed Clinical Social Worker

## 2022-06-04 NOTE — Patient Instructions (Signed)
Visit Information  [name] was given information about Medicaid Managed Care team care coordination services as a part of their [facility] Community Plan Medicaid benefit. [name] verbally consented to engagement with the [facility] Managed Care team.   If you are experiencing a medical emergency, please call 911 or report to your local emergency department or urgent care.   If you have a non-emergency medical problem during routine business hours, please contact your provider's office and ask to speak with a nurse.   For questions related to your [facility], please call: [phone] or visit the homepage here: [email or web address]  If you would like to schedule transportation through your [facility], please call the following number at least 2 days in advance of your appointment: [phone]   Rides for urgent appointments can also be made after hours by calling Member Services.  Call the Behavioral Health Crisis Line at [phone], at any time, 24 hours a day, 7 days a week. If you are in danger or need immediate medical attention call 911.  If you would like help to quit smoking, call 1-800-QUIT-NOW (858-192-6192) OR Espaol: 1-855-Djelo-Ya (1-324-401-0272) o para ms informacin haga clic aqu or Text READY to 536-644 to register via text

## 2022-06-04 NOTE — Patient Outreach (Signed)
Medicaid Managed Care Social Work Note  06/04/2022 Name:  [name] MRN:  [id number] DOB:  December 24, 1965  [name] is an 57 y.o. year old female who is a primary patient of [name], [name], [name].  The Medicaid Managed Care Coordination team was consulted for assistance with:  Mental Health Counseling and Resources  [name] was given information about Medicaid Managed Care Coordination team services today. [name] Patient agreed to services and verbal consent obtained.  Engaged with patient  for by telephone forinitial visit in response to referral for case management and/or care coordination services.   Assessments/Interventions:  Review of past medical history, allergies, medications, health status, including review of consultants reports, laboratory and other test data, was performed as part of comprehensive evaluation and provision of chronic care management services.  SDOH: (Social Determinant of Health) assessments and interventions performed: SDOH Interventions    Flowsheet Row Patient Outreach Telephone from 06/03/2022 in [place] [facility]PART[facility] Office Visit from 05/24/2022 in [facility] from 09/07/2021 in [facility] from 07/09/2021 in [facility]  SDOH Interventions      Housing Interventions Intervention Not Indicated -- -- --  Depression Interventions/Treatment  -- Medication, Currently on Treatment Medication Counseling  Stress Interventions Offered [employer], Provide Counseling -- -- --       Advanced Directives Status:  See Care Plan for related entries.  Care Plan                 Allergies  Allergen Reactions   Penicillins Anaphylaxis   Contrast Media [Iodinated Contrast Media] Other (See Comments)    unknown   Sulfa Antibiotics Rash    Medications Reviewed Today     Reviewed by Tharon Aquas, CMA  (Certified Medical Assistant) on 04/11/22 at 1014  Med List Status: <None>   Medication Order Taking? Sig Documenting Provider Last Dose Status Informant  baclofen (LIORESAL) 10 MG tablet [id number] Yes Take 0.5 tablets (5 mg total) by mouth 4 (four) times daily. X 1 week- then 10 mg/1 tab 4x/day- if need be, after another 1 week, can increase to 15 mg/1.5 tabs 4x/day- for generalized dystonia [name], [name], MD Taking Active   clonazePAM (KLONOPIN) 0.5 MG tablet [id number]  Take 1 tablet (0.5 mg total) by mouth 2 (two) times daily as needed. [name], [name], [name]  Active   dantrolene (DANTRIUM) 50 MG capsule [id number] Yes Take 1 capsule (50 mg total) by mouth at bedtime. X 1 week, then BID x 1 week, then 50 mg TID x 1 week, then 100 mg BID- for dystonia- there are NO OTHER meds that treat dystonia than Dantrolene and Keppra. [name], [name], MD Taking Active   DULoxetine (CYMBALTA) 30 MG capsule [id number] Yes Take 1 capsule (30 mg total) by mouth at bedtime. X 1 week, then 60 mg nightly- for nerve pain can take with low dose Zoloft [name], [name], MD Taking Active   levETIRAcetam (KEPPRA) 250 MG tablet [id number] Yes Take 1 tablet (250 mg total) by mouth 2 (two) times daily. X 1 week; then 500 mg BID- [name], [name], MD Taking Active   ondansetron (ZOFRAN) 4 MG tablet [id number] Yes Take 1 tablet (4 mg total) by mouth every 8 (eight) hours as needed for nausea or vomiting. [name], [name], MD Taking Active   promethazine (PHENERGAN) 12.5 MG tablet [id number] Yes Take 1 tablet (12.5 mg total) by mouth every 6 (six) hours  as needed for nausea or vomiting. [name], [name], MD Taking Active   sertraline (ZOLOFT) 25 MG tablet [id number] Yes Take 1 tablet (25 mg total) by mouth daily. [name], [name], [name] Taking Active             Patient Active Problem List   Diagnosis Date Noted   Anxiety disorder due to general medical condition 05/26/2022   PTSD (post-traumatic stress disorder) 05/26/2022   Reactive  depression 03/15/2022   Primary parkinsonism 03/15/2022   Tremor 05/05/2015   Dystonia 05/05/2015   Breast mass 05/05/2015   Dysuria 05/05/2015    Conditions to be addressed/monitored per PCP order:  Anxiety  Priority: High  Timeframe:  Long-Range Goal Priority:  High Start Date:   06/03/22              Expected End Date:  ongoing                     Follow Up Date--06/11/22 at 64  - keep 90 percent of scheduled appointments -consider counseling or psychiatry -consider bumping up your self-care  -consider creating a stronger support network   Why is this important?             Combatting depression may take some time.            If you don't feel better right away, don't give up on your treatment plan.    Current barriers:   Chronic Mental Health needs related to PTSD, depression, stress and anxiety. Patient requires Support, Education, Resources, Referrals, Advocacy, and Care Coordination, in order to meet Unmet Mental Health Needs. Patient has a diagnosis of Parkinson's Diease and movement disorder Patient will implement clinical interventions discussed today to decrease symptoms of depression and increase knowledge and/or ability of: coping skills. 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  Clinical Goal(s): verbalize understanding of plan for management of Anxiety, Depression, and Stress and demonstrate a reduction in symptoms. Patient will connect with a provider for ongoing mental health treatment, increase coping skills, healthy habits, self-management skills, and stress reduction        Clinical Interventions:  Assessed patient's previous and current treatment, coping skills, support system and barriers to care. Patient provided hx  Verbalization of feelings encouraged, motivational interviewing  employed Emotional support provided, positive coping strategies explored. Establishing healthy boundaries emphasized and healthy self-care education provided Patient was educated on available mental health resources within their area that accept Medicaid and offer counseling and psychiatry. Patient is only interested in COUNSELING at this time. Patient physically wrote down these available mental health resources within her area that accept Medicaid and offer the services that she is interested in. Crisis resources were wrote down as well including the 988 number. Patient receives strong support from sister LCSW provided education on relaxation techniques such as meditation, deep breathing, massage, grounding exercises or yoga that can activate the body's relaxation response and ease symptoms of stress and anxiety. LCSW ask that when pt is struggling with difficult emotions and racing thoughts that they start this relaxation response process. LCSW provided extensive education on healthy coping skills for anxiety. SW used active and reflective listening, validated patient's feelings/concerns, and provided emotional support. Patient will work on implementing appropriate self-care habits into their daily routine such as: staying positive, writing a gratitude list, drinking water, staying active around  the house, taking their medications as prescribed, combating negative thoughts or emotions and staying connected with their family and friends. Positive reinforcement provided for this decision to work on this. LCSW provided education on healthy sleep hygiene and what that looks like. LCSW encouraged patient to implement a night time routine into their schedule that works best for them and that they are able to maintain. Advised patient to implement deep breathing/grounding/meditation/self-care exercises into their nightly routine to combat racing thoughts at night. LCSW encouraged patient to wake up at the same  time each day, make their sleeping environment comfortable, exercise when able, to limit naps and to not eat or drink anything right before bed.  Motivational Interviewing employed Depression screen reviewed  PHQ2/ PHQ9 completed or reviewed  Mindfulness or Relaxation training provided Active listening / Reflection utilized  Advance Care and HCPOA education provided Emotional Support Provided Problem Solving /Task Center strategies reviewed Provided psychoeducation for mental health needs  Provided brief CBT  Reviewed mental health medications and discussed importance of compliance:  Quality of sleep assessed & Sleep Hygiene techniques promoted  Participation in counseling encouraged  Verbalization of feelings encouraged  Suicidal Ideation/Homicidal Ideation assessed: Patient denies SI/HI  Review resources, discussed options and provided patient information about  Mental Health Resources Inter-disciplinary care team collaboration (see longitudinal plan of care) Patient Goals/Self-Care Activities: Over the next 120 days Attend scheduled medical appointments Utilize healthy coping skills and supportive resources discussed Contact PCP with any questions or concerns Keep 90 percent of counseling appointments Call your insurance provider for more information about your Enhanced Benefits  Check out counseling resources provided  Begin personal counseling with LCSW, to reduce and manage symptoms of Depression and Stress, until well-established with mental health provider Accept all calls from mental health representatives in an effort to establish ongoing mental health counseling and supportive services. Incorporate into daily practice - relaxation techniques, deep breathing exercises, and mindfulness meditation strategies. Talk about feelings with friends, family members, spiritual advisor, etc. Contact LCSW directly [phone]), if you have questions, need assistance, or if additional  social work needs are identified between now and our next scheduled telephone outreach call. Call 988 for mental health hotline/crisis line if needed (24/7 available) Try techniques to reduce symptoms of anxiety/negative thinking (deep breathing, distraction, positive self talk, etc)  - develop a personal safety plan - develop a plan to deal with triggers like holidays, anniversaries - exercise at least 2 to 3 times per week - have a plan for how to handle bad days - journal feelings and what helps to feel better or worse - spend time or talk with others at least 2 to 3 times per week - watch for early signs of feeling worse - begin personal counseling - call and visit an old friend - check out volunteer opportunities - join a support group - laugh; watch a funny movie or comedian - learn and use visualization or guided imagery - perform a random act of kindness - practice relaxation or meditation daily - start or continue a personal journal - practice positive thinking and self-talk -continue with compliance of taking medication  -identify current effective and ineffective coping strategies.  -implement positive self-talk in care to increase self-esteem, confidence and feelings of control.  -consider alternative and complementary therapy approaches such as meditation, mindfulness or yoga.  -journaling, prayer, worship services, meditation or pastoral counseling.  -increase participation in pleasurable group activities such as hobbies, singing, sports or volunteering).  -consider the use of  meditative movement therapy such as tai chi, yoga or qigong.  -start a regular daily exercise program based on tolerance, ability and patient choice to support positive thinking and activity    If you are experiencing a Mental Health or Behavioral Health Crisis or need someone to talk to, please call the Suicide and Crisis Lifeline: 988    Patient Goals: Initial goal     24- Hour Availability:     [facility]  [address] [place], [place] [phone] Crisis [phone]   [facility] [phone]  [place] [facility]  [phone]    [facility]  [phone] (after hours)   Therapeutic Alternative/Mobile Crisis   [phone]   [place] National Suicide Hotline  3186262479 [place]) [place] [address]   Call [phone])108-6743 for mental health emergencies   [facility]  ([phone]);  [place] and [employer]  ([phone]); [place], [place], [place], [place], [place], [place], [place]    [facility] [place] Residents For 24/7 walk-up access to mental health services for [facility] children (4+), adolescents and adults, please visit the [facility] located at [address] in [place], [place].  *[facility] also provides comprehensive outpatient behavioral health services in a variety of locations around the [place].  Connect With [place] [address] [place], [place] [address] HelpLine: (660)274-1433 or 1-(667)585-8028  Get Directions  Find Help 24/7 By Phone Call our 24-hour HelpLine at [phone] or [phone] for immediate assistance for mental health and substance abuse issues.  Walk-In Help [place]: [facility] (Ages 4 and Up) [place]: Emergency Dept., [facility] Additional Resources National Hopeline Network: 1-800-SUICIDE The National Suicide Prevention Lifeline: 1-800-273-TALK      Follow up:  Patient agrees to Care Plan and Follow-up.  Plan: The Managed Medicaid care management team will reach out to the patient again over the next 30 days.

## 2022-06-11 ENCOUNTER — Other Ambulatory Visit: Payer: Medicaid Other | Admitting: Licensed Clinical Social Worker

## 2022-06-11 DIAGNOSIS — F329 Major depressive disorder, single episode, unspecified: Secondary | ICD-10-CM

## 2022-06-11 DIAGNOSIS — F4310 Post-traumatic stress disorder, unspecified: Secondary | ICD-10-CM

## 2022-06-11 DIAGNOSIS — F064 Anxiety disorder due to known physiological condition: Secondary | ICD-10-CM

## 2022-06-11 NOTE — Patient Outreach (Signed)
Medicaid Managed Care Social Work Note  06/11/2022 Name:  [name] MRN:  [id number] DOB:  [birth year]  [name] is an 57 y.o. year old female who is a primary patient of [name], [name], [name].  The Medicaid Managed Care Coordination team was consulted for assistance with:  Mental Health Counseling and Resources  Ms. Eskelson was given information about Medicaid Managed Care Coordination team services today. [name] Patient agreed to services and verbal consent obtained.  Engaged with patient  for by telephone forfollow up visit in response to referral for case management and/or care coordination services.   Assessments/Interventions:  Review of past medical history, allergies, medications, health status, including review of consultants reports, laboratory and other test data, was performed as part of comprehensive evaluation and provision of chronic care management services.  SDOH: (Social Determinant of Health) assessments and interventions performed: SDOH Interventions    Flowsheet Row Patient Outreach Telephone from 06/03/2022 in [place] [facility] Office Visit from 05/24/2022 in [facility] from 09/07/2021 in [facility] from 07/09/2021 in [facility]  SDOH Interventions      Housing Interventions Intervention Not Indicated -- -- --  Depression Interventions/Treatment  -- Medication, Currently on Treatment Medication Counseling  Stress Interventions Offered [employer], Provide Counseling -- -- --       Advanced Directives Status:  See Care Plan for related entries.  Care Plan                 Allergies  Allergen Reactions   Penicillins Anaphylaxis   Contrast Media [Iodinated Contrast Media] Other (See Comments)    unknown   Sulfa Antibiotics Rash    Medications Reviewed Today     Reviewed by Tharon Aquas, CMA  (Certified Medical Assistant) on 04/11/22 at 1014  Med List Status: <None>   Medication Order Taking? Sig Documenting Provider Last Dose Status Informant  baclofen (LIORESAL) 10 MG tablet [id number] Yes Take 0.5 tablets (5 mg total) by mouth 4 (four) times daily. X 1 week- then 10 mg/1 tab 4x/day- if need be, after another 1 week, can increase to 15 mg/1.5 tabs 4x/day- for generalized dystonia [name], [name], MD Taking Active   clonazePAM (KLONOPIN) 0.5 MG tablet [id number]  Take 1 tablet (0.5 mg total) by mouth 2 (two) times daily as needed. [name], [name], [name]  Active   dantrolene (DANTRIUM) 50 MG capsule [id number] Yes Take 1 capsule (50 mg total) by mouth at bedtime. X 1 week, then BID x 1 week, then 50 mg TID x 1 week, then 100 mg BID- for dystonia- there are NO OTHER meds that treat dystonia than Dantrolene and Keppra. [name], [name], MD Taking Active   DULoxetine (CYMBALTA) 30 MG capsule [id number] Yes Take 1 capsule (30 mg total) by mouth at bedtime. X 1 week, then 60 mg nightly- for nerve pain can take with low dose Zoloft [name], [name], MD Taking Active   levETIRAcetam (KEPPRA) 250 MG tablet [id number] Yes Take 1 tablet (250 mg total) by mouth 2 (two) times daily. X 1 week; then 500 mg BID- [name], [name], MD Taking Active   ondansetron (ZOFRAN) 4 MG tablet [id number] Yes Take 1 tablet (4 mg total) by mouth every 8 (eight) hours as needed for nausea or vomiting. [name], [name], MD Taking Active   promethazine (PHENERGAN) 12.5 MG tablet [id number] Yes Take 1 tablet (12.5 mg total) by mouth every 6 (six)  hours as needed for nausea or vomiting. [name], [name], MD Taking Active   sertraline (ZOLOFT) 25 MG tablet [id number] Yes Take 1 tablet (25 mg total) by mouth daily. [name], [name], [name] Taking Active             Patient Active Problem List   Diagnosis Date Noted   Anxiety disorder due to general medical condition 05/26/2022   PTSD (post-traumatic stress disorder) 05/26/2022   Reactive  depression 03/15/2022   Primary parkinsonism 03/15/2022   Tremor 05/05/2015   Dystonia 05/05/2015   Breast mass 05/05/2015   Dysuria 05/05/2015    Conditions to be addressed/monitored per PCP order:  Anxiety and Depression  Priority: High  Timeframe:  Long-Range Goal Priority:  High Start Date:   06/03/22              Expected End Date:  ongoing                     Follow Up Date--06/25/22 at 1045  - keep 90 percent of scheduled appointments -consider counseling or psychiatry -consider bumping up your self-care  -consider creating a stronger support network   Why is this important?             Combatting depression may take some time.            If you don't feel better right away, don't give up on your treatment plan.    Current barriers:   Chronic Mental Health needs related to PTSD, depression, stress and anxiety. Patient requires Support, Education, Resources, Referrals, Advocacy, and Care Coordination, in order to meet Unmet Mental Health Needs. Patient has a diagnosis of Parkinson's Diease and movement disorder Patient will implement clinical interventions discussed today to decrease symptoms of depression and increase knowledge and/or ability of: coping skills. 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  Clinical Goal(s): verbalize understanding of plan for management of Anxiety, Depression, and Stress and demonstrate a reduction in symptoms. Patient will connect with a provider for ongoing mental health treatment, increase coping skills, healthy habits, self-management skills, and stress reduction        Clinical Interventions:  Assessed patient's previous and current treatment, coping skills, support system and barriers to care. Patient provided hx  Verbalization of feelings encouraged, motivational  interviewing employed Emotional support provided, positive coping strategies explored. Establishing healthy boundaries emphasized and healthy self-care education provided Patient was educated on available mental health resources within their area that accept Medicaid and offer counseling and psychiatry. Patient is only interested in COUNSELING at this time. Patient physically wrote down these available mental health resources within her area that accept Medicaid and offer the services that she is interested in. Crisis resources were wrote down as well including the 988 number. Patient receives strong support from sister LCSW provided education on relaxation techniques such as meditation, deep breathing, massage, grounding exercises or yoga that can activate the body's relaxation response and ease symptoms of stress and anxiety. LCSW ask that when pt is struggling with difficult emotions and racing thoughts that they start this relaxation response process. LCSW provided extensive education on healthy coping skills for anxiety. SW used active and reflective listening, validated patient's feelings/concerns, and provided emotional support. Patient will work on implementing appropriate self-care habits into their daily routine such as: staying positive, writing a gratitude list, drinking water,  staying active around the house, taking their medications as prescribed, combating negative thoughts or emotions and staying connected with their family and friends. Positive reinforcement provided for this decision to work on this. LCSW provided education on healthy sleep hygiene and what that looks like. LCSW encouraged patient to implement a night time routine into their schedule that works best for them and that they are able to maintain. Advised patient to implement deep breathing/grounding/meditation/self-care exercises into their nightly routine to combat racing thoughts at night. LCSW encouraged patient to wake up  at the same time each day, make their sleeping environment comfortable, exercise when able, to limit naps and to not eat or drink anything right before bed.  Motivational Interviewing employed Depression screen reviewed  PHQ2/ PHQ9 completed or reviewed  Mindfulness or Relaxation training provided Active listening / Reflection utilized  Advance Care and HCPOA education provided Emotional Support Provided Problem Solving /Task Center strategies reviewed Provided psychoeducation for mental health needs  Provided brief CBT  Reviewed mental health medications and discussed importance of compliance:  Quality of sleep assessed & Sleep Hygiene techniques promoted  Participation in counseling encouraged  Verbalization of feelings encouraged  Suicidal Ideation/Homicidal Ideation assessed: Patient denies SI/HI  Review resources, discussed options and provided patient information about  Mental Health Resources Inter-disciplinary care team collaboration (see longitudinal plan of care) Update- Care coordination made with providers at [facility]. They do not have a female psychiatrist available and patient has past trauma and refuses to see a female provider based off this very important concern and barrier. Referral made for counseling only but wait list for therapy is up til mid July of 2024 per patient.   Patient Goals/Self-Care Activities: Over the next 120 days Attend scheduled medical appointments Utilize healthy coping skills and supportive resources discussed Contact PCP with any questions or concerns Keep 90 percent of counseling appointments Call your insurance provider for more information about your Enhanced Benefits  Check out counseling resources provided  Begin personal counseling with LCSW, to reduce and manage symptoms of Depression and Stress, until well-established with mental health provider Accept all calls from mental health representatives in an effort to establish  ongoing mental health counseling and supportive services. Incorporate into daily practice - relaxation techniques, deep breathing exercises, and mindfulness meditation strategies. Talk about feelings with friends, family members, spiritual advisor, etc. Contact LCSW directly [phone]), if you have questions, need assistance, or if additional social work needs are identified between now and our next scheduled telephone outreach call. Call 988 for mental health hotline/crisis line if needed (24/7 available) Try techniques to reduce symptoms of anxiety/negative thinking (deep breathing, distraction, positive self talk, etc)  - develop a personal safety plan - develop a plan to deal with triggers like holidays, anniversaries - exercise at least 2 to 3 times per week - have a plan for how to handle bad days - journal feelings and what helps to feel better or worse - spend time or talk with others at least 2 to 3 times per week - watch for early signs of feeling worse - begin personal counseling - call and visit an old friend - check out volunteer opportunities - join a support group - laugh; watch a funny movie or comedian - learn and use visualization or guided imagery - perform a random act of kindness - practice relaxation or meditation daily - start or continue a personal journal - practice positive thinking and self-talk -continue with compliance of taking medication  -  identify current effective and ineffective coping strategies.  -implement positive self-talk in care to increase self-esteem, confidence and feelings of control.  -consider alternative and complementary therapy approaches such as meditation, mindfulness or yoga.  -journaling, prayer, worship services, meditation or pastoral counseling.  -increase participation in pleasurable group activities such as hobbies, singing, sports or volunteering).  -consider the use of meditative movement therapy such as tai chi, yoga or  qigong.  -start a regular daily exercise program based on tolerance, ability and patient choice to support positive thinking and activity    If you are experiencing a Mental Health or Behavioral Health Crisis or need someone to talk to, please call the Suicide and Crisis Lifeline: 988    Patient Goals: Initial goal     24- Hour Availability:    [facility]  [address] [place], [place] [phone] Crisis [phone]   [facility] [phone]  [place] Crisis [facility]  [phone]    [facility]  [phone] (after hours)   Therapeutic Alternative/Mobile Crisis   [phone]   [place] National Suicide Hotline  484-398-9414 [place]) [place] 884   Call [phone] for mental health emergencies   [facility]  [phone]);  [place] and [employer]  [phone]); [place], [place], [place], [place], [place], [place], [place]    [place] [facility] for [facility] Residents For 24/7 walk-up access to mental health services for [facility] children (4+), adolescents and adults, please visit the [facility] located at [address] in [place], [place].  *[facility] also provides comprehensive outpatient behavioral health services in a variety of locations around the [place].  Connect With [place] [address] [place], [place] [address] HelpLine: 807-476-3569 or 1-(830)591-9108  Get Directions  Find Help 24/7 By Phone Call our 24-hour HelpLine at 325-782-0094 or (816)233-3206 for immediate assistance for mental health and substance abuse issues.  Walk-In Help [place]: [facility] (Ages 4 and Up) [place]: Emergency Dept., [facility] Additional Resources National Hopeline Network: 1-800-SUICIDE The National Suicide Prevention Lifeline: 1-800-273-TALK        Follow up:  Patient agrees to Care Plan and Follow-up.  Plan: The Managed Medicaid care management team will reach out to the patient again over the next 30 days.  Dickie La, BSW, MSW, [employer] Managed Medicaid LCSW [facility]  [facility] [email or web address] Phone: [phone]

## 2022-06-11 NOTE — Patient Instructions (Signed)
Visit Information  Ms. Pilarczyk was given information about Medicaid Managed Care team care coordination services as a part of their [facility] Community Plan Medicaid benefit. [name] verbally consented to engagement with the [facility] Managed Care team.   If you are experiencing a medical emergency, please call 911 or report to your local emergency department or urgent care.   If you have a non-emergency medical problem during routine business hours, please contact your provider's office and ask to speak with a nurse.   For questions related to your [facility], please call: [phone] or visit the homepage here: [email or web address]  If you would like to schedule transportation through your [facility], please call the following number at least 2 days in advance of your appointment: [phone]   Rides for urgent appointments can also be made after hours by calling Member Services.  Call the Behavioral Health Crisis Line at 707-391-7224, at any time, 24 hours a day, 7 days a week. If you are in danger or need immediate medical attention call 911.  If you would like help to quit smoking, call 1-800-QUIT-NOW ((929)618-0122) OR Espaol: 1-855-Djelo-Ya (1-324-401-0272) o para ms informacin haga clic aqu or Text READY to 536-644 to register via text  Dickie La, BSW, MSW, [employer] Managed Medicaid LCSW [facility]  [facility] [email or web address] Phone: [phone]

## 2022-06-12 ENCOUNTER — Encounter
Payer: Medicaid Other | Attending: Physical Medicine and Rehabilitation | Admitting: Physical Medicine and Rehabilitation

## 2022-06-12 DIAGNOSIS — M7918 Myalgia, other site: Secondary | ICD-10-CM | POA: Insufficient documentation

## 2022-06-12 DIAGNOSIS — F329 Major depressive disorder, single episode, unspecified: Secondary | ICD-10-CM | POA: Insufficient documentation

## 2022-06-12 DIAGNOSIS — G249 Dystonia, unspecified: Secondary | ICD-10-CM | POA: Insufficient documentation

## 2022-06-14 ENCOUNTER — Ambulatory Visit: Payer: Medicaid Other | Admitting: Physical Medicine and Rehabilitation

## 2022-06-21 ENCOUNTER — Encounter: Payer: Self-pay | Admitting: Physician Assistant

## 2022-06-21 ENCOUNTER — Telehealth (INDEPENDENT_AMBULATORY_CARE_PROVIDER_SITE_OTHER): Payer: Medicaid Other | Admitting: Physician Assistant

## 2022-06-21 VITALS — Ht 66.0 in | Wt 138.0 lb

## 2022-06-21 DIAGNOSIS — F064 Anxiety disorder due to known physiological condition: Secondary | ICD-10-CM | POA: Diagnosis not present

## 2022-06-21 DIAGNOSIS — F4310 Post-traumatic stress disorder, unspecified: Secondary | ICD-10-CM

## 2022-06-21 DIAGNOSIS — G249 Dystonia, unspecified: Secondary | ICD-10-CM | POA: Diagnosis not present

## 2022-06-21 MED ORDER — MIRTAZAPINE 7.5 MG PO TABS: 7.5000 mg | ORAL_TABLET | Freq: Every day | ORAL | 0 refills | Status: DC

## 2022-06-21 NOTE — Progress Notes (Signed)
   Virtual Visit via Video Note  I connected with  [name]  on 06/24/22 at  9:30 AM EDT by a video enabled telemedicine application and verified that I am speaking with the correct person using two identifiers.  Location: Patient: home Provider: Nature conservation officer at [employer] Persons present: Patient and myself   I discussed the limitations of evaluation and management by telemedicine and the availability of in person appointments. The patient expressed understanding and agreed to proceed.   History of Present Illness:  57 yo female presents for VV recheck from previous visit on 05/24/22. She has not seen any changes since increasing the Zoloft to 50 mg. Only taking Klonopin 1 mg as needed, did not decide to start on this on a scheduled basis yet. She is working with Child psychotherapist, but says resources limited in [place].    Observations/Objective:   Gen: Awake, alert, tearful Resp: Breathing is even and non-labored Psych: tearful, depressed, anxious Neuro: Alert and Oriented x 3, + facial symmetry, speech is clear.   Assessment and Plan:  Problem List Items Addressed This Visit       Nervous and Auditory   Dystonia    Follows with Dr. Berline Chough        Other   Anxiety disorder due to general medical condition - Primary    Worsening as described in HPI. Denies any SI.  She is very nervous / hesitant about medicine overall Continue Zoloft 50 mg. Add Remeron 7.5 mg at bedtime - states she's not sleeping at all & limited appetite; this should help.  She has Klonopin that she takes one to two times daily as needed; encouraged her to increase this dose to Klonopin 1 mg BID, scheduled.  PDMP reviewed today, no red flags, filling appropriately.  She needs to consider counseling.        Relevant Medications   mirtazapine (REMERON) 7.5 MG tablet   PTSD (post-traumatic stress disorder)   Relevant Medications   mirtazapine (REMERON) 7.5 MG tablet      Follow Up Instructions:    I discussed the assessment and treatment plan with the patient. The patient was provided an opportunity to ask questions and all were answered. The patient agreed with the plan and demonstrated an understanding of the instructions.   The patient was advised to call back or seek an in-person evaluation if the symptoms worsen or if the condition fails to improve as anticipated.  Eliam Snapp M Shavona Gunderman, [name]

## 2022-06-24 ENCOUNTER — Other Ambulatory Visit: Payer: Medicaid Other | Admitting: Obstetrics and Gynecology

## 2022-06-24 ENCOUNTER — Encounter: Payer: Self-pay | Admitting: Obstetrics and Gynecology

## 2022-06-24 NOTE — Assessment & Plan Note (Signed)
Follows with Dr. [name] 

## 2022-06-24 NOTE — Patient Outreach (Signed)
Care Coordination  06/24/2022  [name] December 02, 1965 [id number]  RNCM called patient at scheduled time and no answer, unable to leave a message.  Patient called back and unable to complete INITIAL assessment right now, requests an appointment time at end of the day.  RNCM rescheduled appointment at patient request.  Kathi Der RN, BSN [place]  [facility] Care Management Coordinator - Managed [place] High Risk [phone].

## 2022-06-24 NOTE — Assessment & Plan Note (Signed)
Worsening as described in HPI. Denies any SI.  She is very nervous / hesitant about medicine overall Continue Zoloft 50 mg. Add Remeron 7.5 mg at bedtime - states she's not sleeping at all & limited appetite; this should help.  She has Klonopin that she takes one to two times daily as needed; encouraged her to increase this dose to Klonopin 1 mg BID, scheduled.  PDMP reviewed today, no red flags, filling appropriately.  She needs to consider counseling.

## 2022-06-25 ENCOUNTER — Other Ambulatory Visit: Payer: Medicaid Other | Admitting: Licensed Clinical Social Worker

## 2022-06-25 ENCOUNTER — Encounter: Payer: Self-pay | Admitting: Licensed Clinical Social Worker

## 2022-06-25 NOTE — Patient Instructions (Signed)
Visit Information  [name] was given information about Medicaid Managed Care team care coordination services as a part of their [employer] Community Plan Medicaid benefit. [name] verbally consented to engagement with the Medicaid Managed Care team.   If you are experiencing a medical emergency, please call 911 or report to your local emergency department or urgent care.   If you have a non-emergency medical problem during routine business hours, please contact your provider's office and ask to speak with a nurse.   For questions related to your [employer] Community Plan Medicaid, please call: [phone] or visit the homepage here: [email or web address]  If you would like to schedule transportation through your [employer] Community Plan Medicaid, please call the following number at least 2 days in advance of your appointment: [phone]   Rides for urgent appointments can also be made after hours by calling Member Services.  Call the Behavioral Health Crisis Line at 1-877-334-1141, at any time, 24 hours a day, 7 days a week. If you are in danger or need immediate medical attention call 911.  If you would like help to quit smoking, call 1-800-QUIT-NOW (1-800-784-8669) OR Espaol: 1-855-Djelo-Ya (1-855-335-3569) o para ms informacin haga clic aqu or Text READY to 200-400 to register via text  Miken Stecher, BSW, MSW, LCSW Managed Medicaid LCSW [place]  [facility] [email or web address] Phone: [phone]   

## 2022-06-25 NOTE — Patient Instructions (Signed)
Visit Information  [name] was given information about Medicaid Managed Care team care coordination services as a part of their [facility] Community Plan Medicaid benefit. [name] verbally consented to engagement with the [facility] Managed Care team.   If you are experiencing a medical emergency, please call 911 or report to your local emergency department or urgent care.   If you have a non-emergency medical problem during routine business hours, please contact your provider's office and ask to speak with a nurse.   For questions related to your [facility], please call: [phone] or visit the homepage here: [email or web address]  If you would like to schedule transportation through your [facility], please call the following number at least 2 days in advance of your appointment: [phone]   Rides for urgent appointments can also be made after hours by calling Member Services.  Call the Behavioral Health Crisis Line at [phone], at any time, 24 hours a day, 7 days a week. If you are in danger or need immediate medical attention call 911.  If you would like help to quit smoking, call 1-800-QUIT-NOW (765-590-1039) OR Espaol: 1-855-Djelo-Ya (1-324-401-0272) o para ms informacin haga clic aqu or Text READY to 536-644 to register via text  There are no care plans that you recently modified to display for this patient.

## 2022-06-25 NOTE — Patient Outreach (Signed)
Medicaid Managed Care Social Work Note  06/25/2022 Name:  [name] MRN:  [id number] DOB:  November 30, 1965  Further care coordination took place between [facility] LCSW, PCP, [facility] RNCM and [facility] team to investigate patent's specific request for holistic approaches/resources/supportive services for both her physical and mental health care needs .  Assessments/Interventions:  Review of past medical history, allergies, medications, health status, including review of consultants reports, laboratory and other test data, was performed as part of comprehensive evaluation and provision of chronic care management services.  SDOH: (Social Determinant of Health) assessments and interventions performed: SDOH Interventions    Flowsheet Row Patient Outreach Telephone from 06/25/2022 in [place] POPULATION HEALTH DEPARTMENT Patient Outreach Telephone from 06/24/2022 in [place] POPULATION HEALTH DEPARTMENT Patient Outreach Telephone from 06/03/2022 in [place] POPULATION HEALTH DEPARTMENT Office Visit from 05/24/2022 in [facility] [employer] Visit from 09/07/2021 in [facility] [employer] from 07/09/2021 in [facility] [employer]  SDOH Interventions        Food Insecurity Interventions -- Intervention Not Indicated -- -- -- --  Housing Interventions -- -- Intervention Not Indicated -- -- --  Transportation Interventions -- Intervention Not Indicated -- -- -- --  Utilities Interventions -- Intervention Not Indicated -- -- -- --  Depression Interventions/Treatment  -- -- -- Medication, Currently on Treatment Medication Counseling  Stress Interventions Offered [employer], Provide Counseling -- [employer], Provide Counseling -- -- --       Care Plan                 Allergies  Allergen Reactions   Penicillins Anaphylaxis   Contrast Media [Iodinated Contrast Media] Other (See Comments)    unknown   Sulfa  Antibiotics Rash    Medications Reviewed Today     Reviewed by Danie Chandler, RN (Registered Nurse) on 06/24/22 at 1308  Med List Status: <None>   Medication Order Taking? Sig Documenting Provider Last Dose Status Informant  baclofen (LIORESAL) 10 MG tablet [id number] No TAKE 1/2 (ONE-HALF) TABLET BY MOUTH 4 TIMES DAILY FOR 7 DAYS THEN TAKE 1 TABLET 4 TIMES DAILY IF NEED BE, AFTER ANOTHER ONE WEEK, CAN INCREASE TO 1 & 1/2 (ONE & ONE-HALF) TABLETS 4 TIMES DAILY FOR GENERALIZED DYSTONIA [provider] Taking Active   clonazePAM (KLONOPIN) 1 MG tablet [id number] No Take 1 tablet (1 mg total) by mouth 2 (two) times daily. [name], [name], [name] Taking Active   dantrolene (DANTRIUM) 50 MG capsule [id number] No TAKE 1 CAPSULE BY MOUTH AT BEDTIME FOR 7 DAYS, THEN 1 CAPSULE TWICE DAILY FOR 7 DAYS, THEN 1 CAPSULE THREE TIMES DAILY FOR 7 DAYS, THEN 2 CAPSULES TWICE DAILY FOR DYSTONIA. THERE ARE NO OTHER MEDS THAT TREAT DYSTONIA THAN DANTROLENE AND KEPPRA. [provider] Taking Active   levETIRAcetam (KEPPRA) 250 MG tablet [id number] No Take by mouth. [provider] Taking Active   mirtazapine (REMERON) 7.5 MG tablet [id number]  Take 1 tablet (7.5 mg total) by mouth at bedtime. [name], [name], [name]  Active   ondansetron (ZOFRAN) 4 MG tablet [id number] No Take 1 tablet (4 mg total) by mouth every 8 (eight) hours as needed for nausea or vomiting. [name], [name], MD Taking Active   promethazine (PHENERGAN) 12.5 MG tablet [id number] No Take 1 tablet (12.5 mg total) by mouth every 6 (six) hours as needed for nausea or vomiting. [name], [name], MD Taking Active   sertraline (ZOLOFT) 50 MG tablet [id number] No  Take 1 tablet (50 mg total) by mouth at bedtime. [name], [name], [name] Taking Active   tiZANidine (ZANAFLEX) 4 MG tablet [id number] No Indications: muscle spasm [provider] Taking Active             Patient Active Problem List   Diagnosis Date Noted   Anxiety  disorder due to general medical condition 05/26/2022   PTSD (post-traumatic stress disorder) 05/26/2022   Reactive depression 03/15/2022   Primary parkinsonism 03/15/2022   Tremor 05/05/2015   Dystonia 05/05/2015   Breast mass 05/05/2015   Dysuria 05/05/2015   Priority: High  Timeframe:  Short-Range Goal Priority:  High Start Date:   06/03/22              Expected End Date:  ongoing                     Follow Up Date--07/16/22 at 1045 am  - keep 90 percent of scheduled appointments -consider counseling or psychiatry -consider bumping up your self-care  -consider creating a stronger support network   Why is this important?             Combatting depression may take some time.            If you don't feel better right away, don't give up on your treatment plan.    Current barriers:   Chronic Mental Health needs related to PTSD, depression, stress and anxiety. Patient requires Support, Education, Resources, Referrals, Advocacy, and Care Coordination, in order to meet Unmet Mental Health Needs. Patient has a diagnosis of Parkinson's Diease and movement disorder Patient will implement clinical interventions discussed today to decrease symptoms of depression and increase knowledge and/or ability of: coping skills. 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  Clinical Goal(s): verbalize understanding of plan for management of Anxiety, Depression, and Stress and demonstrate a reduction in symptoms. Patient will connect with a provider for ongoing mental health treatment, increase coping skills, healthy habits, self-management skills, and stress reduction        Clinical Interventions:  Assessed patient's previous and current treatment, coping skills, support system and barriers to care. Patient provided hx  Verbalization of  feelings encouraged, motivational interviewing employed Emotional support provided, positive coping strategies explored. Establishing healthy boundaries emphasized and healthy self-care education provided Patient was educated on available mental health resources within their area that accept Medicaid and offer counseling and psychiatry. Patient is only interested in COUNSELING at this time. Patient physically wrote down these available mental health resources within her area that accept Medicaid and offer the services that she is interested in. Crisis resources were wrote down as well including the 988 number. Patient receives strong support from sister LCSW provided education on relaxation techniques such as meditation, deep breathing, massage, grounding exercises or yoga that can activate the body's relaxation response and ease symptoms of stress and anxiety. LCSW ask that when pt is struggling with difficult emotions and racing thoughts that they start this relaxation response process. LCSW provided extensive education on healthy coping skills for anxiety. SW used active and reflective listening, validated patient's feelings/concerns, and provided emotional support. Patient will work on implementing appropriate self-care habits into their daily routine such as: staying positive, writing a gratitude list, drinking water, staying active around the house, taking their medications as prescribed, combating negative thoughts or emotions  and staying connected with their family and friends. Positive reinforcement provided for this decision to work on this. LCSW provided education on healthy sleep hygiene and what that looks like. LCSW encouraged patient to implement a night time routine into their schedule that works best for them and that they are able to maintain. Advised patient to implement deep breathing/grounding/meditation/self-care exercises into their nightly routine to combat racing thoughts at night.  LCSW encouraged patient to wake up at the same time each day, make their sleeping environment comfortable, exercise when able, to limit naps and to not eat or drink anything right before bed.  Motivational Interviewing employed Depression screen reviewed  PHQ2/ PHQ9 completed or reviewed  Mindfulness or Relaxation training provided Active listening / Reflection utilized  Advance Care and HCPOA education provided Emotional Support Provided Problem Solving /Task Center strategies reviewed Provided psychoeducation for mental health needs  Provided brief CBT  Reviewed mental health medications and discussed importance of compliance:  Quality of sleep assessed & Sleep Hygiene techniques promoted  Participation in counseling encouraged  Verbalization of feelings encouraged  Suicidal Ideation/Homicidal Ideation assessed: Patient denies SI/HI  Review resources, discussed options and provided patient information about  Mental Health Resources Inter-disciplinary care team collaboration (see longitudinal plan of care) Update- Care coordination made with providers at [facility] [place]. They do not have a female psychiatrist available and patient has past trauma and refuses to see a female provider based off this very important concern and barrier. Referral made for counseling only but wait list for therapy is up til mid July of 2024 per patient. [facility] LCSW update- Patient was recently put on some new psychiatric medicine Remeron and her zoloft was increased but patient reports no relief of symptoms just yet. [facility] LCSW provided education on medication management especially in regards to mental health medications. Patient was successfully scheduled for her initial therapy appointment at [facility] [place] on 08/20/22. Patient is aware of her crisis support resources that she can utilize if needed while she waits to get established with a long term mental health provider. PCP updated. Patient is  still deciding on whether on not she wishes to gain a psychiatry referral. UPDATE for 06/25/22 as well- Further care coordination took place between [facility] LCSW, PCP, [facility] RNCM and [facility] team to investigate patent's specific request for holistic approaches/resources/supportive services for both her physical and mental health care needs .  Patient Goals/Self-Care Activities: Over the next 120 days Attend scheduled medical appointments Utilize healthy coping skills and supportive resources discussed Contact PCP with any questions or concerns Keep 90 percent of counseling appointments Call your insurance provider for more information about your Enhanced Benefits  Check out counseling resources provided  Begin personal counseling with LCSW, to reduce and manage symptoms of Depression and Stress, until well-established with mental health provider Accept all calls from mental health representatives in an effort to establish ongoing mental health counseling and supportive services. Incorporate into daily practice - relaxation techniques, deep breathing exercises, and mindfulness meditation strategies. Talk about feelings with friends, family members, spiritual advisor, etc. Contact LCSW directly [phone]), if you have questions, need assistance, or if additional social work needs are identified between now and our next scheduled telephone outreach call. Call 988 for mental health hotline/crisis line if needed (24/7 available) Try techniques to reduce symptoms of anxiety/negative thinking (deep breathing, distraction, positive self talk, etc)  - develop a personal safety plan - develop a plan to deal with triggers like holidays,  anniversaries - exercise at least 2 to 3 times per week - have a plan for how to handle bad days - journal feelings and what helps to feel better or worse - spend time or talk with others at least 2 to 3 times per week - watch for early signs of feeling worse - begin personal  counseling - call and visit an old friend - check out volunteer opportunities - join a support group - laugh; watch a funny movie or comedian - learn and use visualization or guided imagery - perform a random act of kindness - practice relaxation or meditation daily - start or continue a personal journal - practice positive thinking and self-talk -continue with compliance of taking medication  -identify current effective and ineffective coping strategies.  -implement positive self-talk in care to increase self-esteem, confidence and feelings of control.  -consider alternative and complementary therapy approaches such as meditation, mindfulness or yoga.  -journaling, prayer, worship services, meditation or pastoral counseling.  -increase participation in pleasurable group activities such as hobbies, singing, sports or volunteering).  -consider the use of meditative movement therapy such as tai chi, yoga or qigong.  -start a regular daily exercise program based on tolerance, ability and patient choice to support positive thinking and activity    If you are experiencing a Mental Health or Behavioral Health Crisis or need someone to talk to, please call the Suicide and Crisis Lifeline: 988    Patient Goals: Initial goal     24- Hour Availability:    [facility]  [address] [place], [place] [phone] Crisis [phone]   [facility] [phone]  [facility] [facility]  [phone]    [facility]  [phone] (after hours)   Therapeutic Alternative/Mobile Crisis   [phone]   [place] National Suicide Hotline  270-146-3292 Len Child[place]) [place] [address]   Call 769-300-9498 for mental health emergencies   [facility]  [phone]);  [place] and [employer]  ([phone]); [place], [place], [place], [place], [place], [place], [place]    [facility]  for [facility] Residents For 24/7 walk-up access to mental health services for [facility] children (4+), adolescents and adults, please visit the [facility] located at [address] in [place], [place].  *[place] also provides comprehensive outpatient behavioral health services in a variety of locations around the [place].  Connect With [place] [address] [place], [place] [address] HelpLine: 902-179-8128 or 1-(564)214-0186  Get Directions  Find Help 24/7 By Phone Call our 24-hour HelpLine at 806-709-8031 or 309-475-9204 for immediate assistance for mental health and substance abuse issues.  Walk-In Help [place]: [facility] (Ages 4 and Up) [place]: Emergency Dept., [facility] Additional Resources National Hopeline Network: 1-800-SUICIDE The National Suicide Prevention Lifeline: 1-800-273-TALK      Follow up:  Patient agrees to Care Plan and Follow-up.  Plan: The Managed Medicaid care management team will reach out to the patient again over the next 30 days.  Dickie La, BSW, MSW, [employer] Managed Medicaid LCSW [facility]  [facility] [email or web address] Phone: [phone]

## 2022-06-25 NOTE — Patient Outreach (Signed)
Medicaid Managed Care Social Work Note  06/25/2022 Name:  [name] MRN:  [id number] DOB:  [birth year]  [name] is an 57 y.o. year old female who is a primary patient of [name], [name], [name].  The Medicaid Managed Care Coordination team was consulted for assistance with:  Mental Health Counseling and Resources  [name] was given information about Medicaid Managed Care Coordination team services today. [name] Patient agreed to services and verbal consent obtained.  Engaged with patient  for by telephone forfollow up visit in response to referral for case management and/or care coordination services.   Assessments/Interventions:  Review of past medical history, allergies, medications, health status, including review of consultants reports, laboratory and other test data, was performed as part of comprehensive evaluation and provision of chronic care management services.  SDOH: (Social Determinant of Health) assessments and interventions performed: SDOH Interventions    Flowsheet Row Patient Outreach Telephone from 06/25/2022 in [facility] Patient Outreach Telephone from 06/24/2022 in [facility] Patient Outreach Telephone from 06/03/2022 in  POPULATION HEALTH DEPARTMENT Office Visit from 05/24/2022 in [facility] [employer] Visit from 09/07/2021 in [facility] [employer] from 07/09/2021 in [facility] [employer]  SDOH Interventions        Food Insecurity Interventions -- Intervention Not Indicated -- -- -- --  Housing Interventions -- -- Intervention Not Indicated -- -- --  Transportation Interventions -- Intervention Not Indicated -- -- -- --  Utilities Interventions -- Intervention Not Indicated -- -- -- --  Depression Interventions/Treatment  -- -- -- Medication, Currently on Treatment Medication Counseling  Stress Interventions Offered  [employer], Provide Counseling -- [employer], Provide Counseling -- -- --       Advanced Directives Status:  See Care Plan for related entries.  Care Plan                 Allergies  Allergen Reactions   Penicillins Anaphylaxis   Contrast Media [Iodinated Contrast Media] Other (See Comments)    unknown   Sulfa Antibiotics Rash    Medications Reviewed Today     Reviewed by Danie Chandler, RN (Registered Nurse) on 06/24/22 at 1308  Med List Status: <None>   Medication Order Taking? Sig Documenting Provider Last Dose Status Informant  baclofen (LIORESAL) 10 MG tablet [id number] No TAKE 1/2 (ONE-HALF) TABLET BY MOUTH 4 TIMES DAILY FOR 7 DAYS THEN TAKE 1 TABLET 4 TIMES DAILY IF NEED BE, AFTER ANOTHER ONE WEEK, CAN INCREASE TO 1 & 1/2 (ONE & ONE-HALF) TABLETS 4 TIMES DAILY FOR GENERALIZED DYSTONIA [provider] Taking Active   clonazePAM (KLONOPIN) 1 MG tablet [id number] No Take 1 tablet (1 mg total) by mouth 2 (two) times daily. [name], [name], [name] Taking Active   dantrolene (DANTRIUM) 50 MG capsule [id number] No TAKE 1 CAPSULE BY MOUTH AT BEDTIME FOR 7 DAYS, THEN 1 CAPSULE TWICE DAILY FOR 7 DAYS, THEN 1 CAPSULE THREE TIMES DAILY FOR 7 DAYS, THEN 2 CAPSULES TWICE DAILY FOR DYSTONIA. THERE ARE NO OTHER MEDS THAT TREAT DYSTONIA THAN DANTROLENE AND KEPPRA. [provider] Taking Active   levETIRAcetam (KEPPRA) 250 MG tablet [id number] No Take by mouth. [provider] Taking Active   mirtazapine (REMERON) 7.5 MG tablet [id number]  Take 1 tablet (7.5 mg total) by mouth at bedtime. [name], [name], [name]  Active   ondansetron (ZOFRAN) 4 MG tablet [id number] No  Take 1 tablet (4 mg total) by mouth every 8 (eight) hours as needed for nausea or vomiting. [name], [name], MD Taking Active   promethazine (PHENERGAN) 12.5 MG tablet [id number] No Take 1 tablet (12.5 mg total) by mouth every 6 (six) hours as needed for nausea or vomiting.  [name], [name], MD Taking Active   sertraline (ZOLOFT) 50 MG tablet [id number] No Take 1 tablet (50 mg total) by mouth at bedtime. 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             Combatting depression may take some time.            If you don't feel better right away, don't give up on your treatment plan.    Current barriers:   Chronic Mental Health needs related to PTSD, depression, stress and anxiety. Patient requires Support, Education, Resources, Referrals, Advocacy, and Care Coordination, in order to meet Unmet Mental Health Needs. Patient has a diagnosis of Parkinson's Diease and movement disorder Patient will implement clinical interventions discussed today to decrease symptoms of depression and increase knowledge and/or ability of: coping skills. 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  Clinical Goal(s): verbalize understanding  of plan for management of Anxiety, Depression, and Stress and demonstrate a reduction in symptoms. Patient will connect with a provider for ongoing mental health treatment, increase coping skills, healthy habits, self-management skills, and stress reduction        Clinical Interventions:  Assessed patient's previous and current treatment, coping skills, support system and barriers to care. Patient provided hx  Verbalization of feelings encouraged, motivational interviewing employed Emotional support provided, positive coping strategies explored. Establishing healthy boundaries emphasized and healthy self-care education provided Patient was educated on available mental health resources within their area that accept Medicaid and offer counseling and psychiatry. Patient is only interested in COUNSELING at this time. Patient physically wrote down these available mental health resources within her area that accept Medicaid and offer the services that she is interested in. Crisis resources were wrote down as well including the 988 number. Patient receives strong support from sister LCSW provided education on relaxation techniques such as meditation, deep breathing, massage, grounding exercises or yoga that can activate the body's relaxation response and ease symptoms of stress and anxiety. LCSW ask that when pt  is struggling with difficult emotions and racing thoughts that they start this relaxation response process. LCSW provided extensive education on healthy coping skills for anxiety. SW used active and reflective listening, validated patient's feelings/concerns, and provided emotional support. Patient will work on implementing appropriate self-care habits into their daily routine such as: staying positive, writing a gratitude list, drinking water, staying active around the house, taking their medications as prescribed, combating negative thoughts or emotions and staying connected with their family and  friends. Positive reinforcement provided for this decision to work on this. LCSW provided education on healthy sleep hygiene and what that looks like. LCSW encouraged patient to implement a night time routine into their schedule that works best for them and that they are able to maintain. Advised patient to implement deep breathing/grounding/meditation/self-care exercises into their nightly routine to combat racing thoughts at night. LCSW encouraged patient to wake up at the same time each day, make their sleeping environment comfortable, exercise when able, to limit naps and to not eat or drink anything right before bed.  Motivational Interviewing employed Depression screen reviewed  PHQ2/ PHQ9 completed or reviewed  Mindfulness or Relaxation training provided Active listening / Reflection utilized  Advance Care and HCPOA education provided Emotional Support Provided Problem Solving /Task Center strategies reviewed Provided psychoeducation for mental health needs  Provided brief CBT  Reviewed mental health medications and discussed importance of compliance:  Quality of sleep assessed & Sleep Hygiene techniques promoted  Participation in counseling encouraged  Verbalization of feelings encouraged  Suicidal Ideation/Homicidal Ideation assessed: Patient denies SI/HI  Review resources, discussed options and provided patient information about  Mental Health Resources Inter-disciplinary care team collaboration (see longitudinal plan of care) Update- Care coordination made with providers at [facility]. They do not have a female psychiatrist available and patient has past trauma and refuses to see a female provider based off this very important concern and barrier. Referral made for counseling only but wait list for therapy is up til mid July of 2024 per patient. [facility] LCSW update- Patient was recently put on some new psychiatric medicine Remeron and her zoloft was increased but patient reports  no relief of symptoms just yet. [facility] LCSW provided education on medication management especially in regards to mental health medications. Patient was successfully scheduled for her initial therapy appointment at [facility] on 08/20/22. Patient is aware of her crisis support resources that she can utilize if needed while she waits to get established with a long term mental health provider. PCP updated. Patient is still deciding on whether on not she wishes to gain a psychiatry referral.   Patient Goals/Self-Care Activities: Over the next 120 days Attend scheduled medical appointments Utilize healthy coping skills and supportive resources discussed Contact PCP with any questions or concerns Keep 90 percent of counseling appointments Call your insurance provider for more information about your Enhanced Benefits  Check out counseling resources provided  Begin personal counseling with LCSW, to reduce and manage symptoms of Depression and Stress, until well-established with mental health provider Accept all calls from mental health representatives in an effort to establish ongoing mental health counseling and supportive services. Incorporate into daily practice - relaxation techniques, deep breathing exercises, and mindfulness meditation strategies. Talk about feelings with friends, family members, spiritual advisor, etc. Contact LCSW directly [phone]), if you have questions, need assistance, or if additional social work needs are identified between now and our next scheduled telephone outreach call. Call 988 for mental health  hotline/crisis line if needed (24/7 available) Try techniques to reduce symptoms of anxiety/negative thinking (deep breathing, distraction, positive self talk, etc)  - develop a personal safety plan - develop a plan to deal with triggers like holidays, anniversaries - exercise at least 2 to 3 times per week - have a plan for how to handle bad days -  journal feelings and what helps to feel better or worse - spend time or talk with others at least 2 to 3 times per week - watch for early signs of feeling worse - begin personal counseling - call and visit an old friend - check out volunteer opportunities - join a support group - laugh; watch a funny movie or comedian - learn and use visualization or guided imagery - perform a random act of kindness - practice relaxation or meditation daily - start or continue a personal journal - practice positive thinking and self-talk -continue with compliance of taking medication  -identify current effective and ineffective coping strategies.  -implement positive self-talk in care to increase self-esteem, confidence and feelings of control.  -consider alternative and complementary therapy approaches such as meditation, mindfulness or yoga.  -journaling, prayer, worship services, meditation or pastoral counseling.  -increase participation in pleasurable group activities such as hobbies, singing, sports or volunteering).  -consider the use of meditative movement therapy such as tai chi, yoga or qigong.  -start a regular daily exercise program based on tolerance, ability and patient choice to support positive thinking and activity    If you are experiencing a Mental Health or Behavioral Health Crisis or need someone to talk to, please call the Suicide and Crisis Lifeline: 988    Patient Goals: Initial goal     24- Hour Availability:    [facility]  [address] [place], [place] [phone] Crisis [phone]   [facility] [phone]  [facility]  [phone]    [facility]  [phone] (after hours)   Therapeutic Alternative/Mobile Crisis   [phone]   [place] National Suicide Hotline  702-201-7781 Len Childs) [place] 016   Call [phone])[phone] for mental health emergencies   [facility]   [phone]);  [place] and [employer]  [phone]); [place], [place], [place], [place], [place], [place], [place]    [facility] For 24/7 walk-up access to mental health services for [facility] children (4+), adolescents and adults, please visit the [facility] located at [address] in [place], [place].  *[facility] also provides comprehensive outpatient behavioral health services in a variety of locations around the [place].  Connect With [place] [address] [place], [place] [address] HelpLine: (684)395-8012 or [phone]  Get Directions  Find Help 24/7 By Phone Call our 24-hour HelpLine at 6713095094 or 339-860-3141 for immediate assistance for mental health and substance abuse issues.  Walk-In Help [place]: [facility] (Ages 4 and Up) [place]: Emergency Dept., [facility] Additional Resources National Hopeline Network: 1-800-SUICIDE The National Suicide Prevention Lifeline: 1-800-273-TALK      Follow up:  Patient agrees to Care Plan and Follow-up.  Plan: The Managed Medicaid care management team will reach out to the patient again over the next 30 days.  Dickie La, BSW, MSW, [employer] Managed Medicaid LCSW [facility]  [facility] [email or web address] Phone: [phone]

## 2022-06-26 ENCOUNTER — Encounter: Payer: Medicaid Other | Admitting: Physical Medicine and Rehabilitation

## 2022-06-26 ENCOUNTER — Encounter: Payer: Self-pay | Admitting: Physical Medicine and Rehabilitation

## 2022-06-26 VITALS — BP 146/79 | HR 70 | Ht 66.0 in | Wt 132.0 lb

## 2022-06-26 DIAGNOSIS — G249 Dystonia, unspecified: Secondary | ICD-10-CM

## 2022-06-26 DIAGNOSIS — M7918 Myalgia, other site: Secondary | ICD-10-CM | POA: Diagnosis not present

## 2022-06-26 DIAGNOSIS — F329 Major depressive disorder, single episode, unspecified: Secondary | ICD-10-CM

## 2022-06-26 MED ORDER — LIDOCAINE HCL 1 % IJ SOLN
3.0000 mL | Freq: Once | INTRAMUSCULAR | Status: AC
Start: 2022-06-26 — End: 2022-06-26
  Administered 2022-06-26: 3 mL

## 2022-06-26 NOTE — Progress Notes (Signed)
Pt is a 57 yr old with generalized dystonia and Parkinsonism- also has panic attacks and anxiety and complex PTSD- from chilhood- - no HTN, DM- here for f/u- on dystonia.             Some of Psych meds changed- Changed Zoloft and increased it and increased Klonopin. Also added Remeron. Done last week.   Going to see PCP every 4 weeks on telehealth.   Going to see a therapist- with [facility]- cannot get in til 7/16-    Scare do needles- petrified- but just so scared, even though gets so many   Not taking Keppra and Dantrolene as written.   Keppra-  made too sleepy to increase to 500 mg BID, so taking 250 mg BID.  Nerve pain is no better- burning is the same. Burns all the time.   Dantrolene- taking 2-3 pills/day.  Not sure if helping and "kicked her butt"- making her real sleepy- but cannot sleep.   R hand hurts really badly.   Got brace for R carpal tunnel syndrome on R hand- but hasn't had much improvement  Foot is the same way- burning-   Whole body is burning and R hand/wrist and R foot is burning and aching.    Memory has been doing a lot worse.  Depression is doing worse.   Got approved for disability.   Exam:  Awake, but delayed responses - almost out of it;   sleepy; a little delayed  - more sometimes than others- and near tears entire appt, NAD Head tremors still noticed- but much worse than before- - cannot stop at all Tight musculature in upper traps, scalenes, levators, and rhomboids- severe          Plan: STOP Remeron immediately- because can cause tardive dyskinesia, which can cause shaking  to be worse.      2. Getting more depressed because memory is worse- all she does is cry  -   3. Will stop Keppra-    for nerve pain- in 3 days, can stop  4.      Muscle tightness no better with Dantrolene-   at the end of week, Sunday, stop the Dantrolene.    5. Didn't get Muscle hook at [employer]- helpful to maintain the relaxation today- so get it, please-  2-4 minutes pressure on each trigger point.   6. Patient here for trigger point injections for  Consent done and on chart.  Cleaned areas with alcohol and injected using a 27 gauge 1.5 inch needle  Injected  3cc- no wastage Using 1% Lidocaine with no EPI  Upper traps B/L  Levators- B/L  Posterior scalenes Middle scalenes- B/L  Splenius Capitus Pectoralis Major Rhomboids- B/L  Infraspinatus Teres Major/minor Thoracic paraspinals Lumbar paraspinals Other injections-    Patient's level of pain prior was 11-12/10 Current level of pain after injections is- feels a little loser and has more ROM of neck.   There was no bleeding or complications.  Patient was advised to drink a lot of water on day after injections to flush system Will have increased soreness for 12-48 hours after injections.  Can use Lidocaine patches the day AFTER injections Can use theracane on day of injections in places didn't inject Can use heating pad 4-6 hours AFTER injections  7. Wearing R carpal tunnel brace- - can use voltaren gel- over the counter- can use 4x/day.    8. F/U in 4-6 weeks- for TrP Injectionsa nd f/u on dystonia.  9. Needs letter- why not able to work.    I spent a total of 45   minutes on total care today- >50% coordination of care- due to 10 minutes on injections and rest discussing dystonia, calming pt down and d/w pt about disability letter she needs

## 2022-06-26 NOTE — Patient Instructions (Signed)
Plan: STOP Remeron immediately- because can cause tardive dyskinesia, which can cause shaking  to be worse.      2. Getting more depressed because memory is worse- all she does is cry  -   3. Will stop Keppra-    for nerve pain- in 3 days, can stop  4.      Muscle tightness no better with Dantrolene-   at the end of week, Sunday, stop the Dantrolene.    5. Didn't get Muscle hook at [employer]- helpful to maintain the relaxation today- so get it, please- 2-4 minutes pressure on each trigger point.   6. Patient here for trigger point injections for  Consent done and on chart.  Cleaned areas with alcohol and injected using a 27 gauge 1.5 inch needle  Injected  3cc- no wastage Using 1% Lidocaine with no EPI  Upper traps B/L  Levators- B/L  Posterior scalenes Middle scalenes- B/L  Splenius Capitus Pectoralis Major Rhomboids- B/L  Infraspinatus Teres Major/minor Thoracic paraspinals Lumbar paraspinals Other injections-    Patient's level of pain prior was 11-12/10 Current level of pain after injections is- feels a little loser and has more ROM of neck.   There was no bleeding or complications.  Patient was advised to drink a lot of water on day after injections to flush system Will have increased soreness for 12-48 hours after injections.  Can use Lidocaine patches the day AFTER injections Can use theracane on day of injections in places didn't inject Can use heating pad 4-6 hours AFTER injections  7. Wearing R carpal tunnel brace- - can use voltaren gel- over the counter- can use 4x/day.    8. F/U in 4-6 weeks- for TrP Injectionsa nd f/u on dystonia.

## 2022-06-26 NOTE — Addendum Note (Signed)
Addended by: Janean Sark on: 06/26/2022 10:39 AM   Modules accepted: Orders

## 2022-07-16 ENCOUNTER — Other Ambulatory Visit: Payer: Medicaid Other | Admitting: Licensed Clinical Social Worker

## 2022-07-16 NOTE — Patient Outreach (Signed)
Medicaid Managed Care Social Work Note  07/16/2022 Name:  [name] MRN:  [id number] DOB:  [birth year]  [name] is an 57 y.o. year old female who is a primary patient of [name], [name], [name].  The [facility] Managed Care Coordination team was consulted for assistance with:  Level of Care Concerns  Ms. [name] was given information about Medicaid Managed Care Coordination team services today. [name] Patient agreed to services and verbal consent obtained.  Engaged with patient  for by telephone forfollow up visit in response to referral for case management and/or care coordination services.   Assessments/Interventions:  Review of past medical history, allergies, medications, health status, including review of consultants reports, laboratory and other test data, was performed as part of comprehensive evaluation and provision of chronic care management services.  SDOH: (Social Determinant of Health) assessments and interventions performed: SDOH Interventions    Flowsheet Row Patient Outreach Telephone from 07/16/2022 in [place] POPULATION HEALTH DEPARTMENT Patient Outreach Telephone from 06/25/2022 in [place] POPULATION HEALTH DEPARTMENT Patient Outreach Telephone from 06/24/2022 in [place] POPULATION HEALTH DEPARTMENT Patient Outreach Telephone from 06/03/2022 in [place] POPULATION HEALTH DEPARTMENT Office Visit from 05/24/2022 in [facility] [employer] Visit from 09/07/2021 in [facility]  SDOH [employer] Insecurity Interventions -- -- Intervention Not Indicated -- -- --  Housing Interventions -- -- -- Intervention Not Indicated -- --  Transportation Interventions -- -- Intervention Not Indicated -- -- --  Utilities Interventions -- -- Intervention Not Indicated -- -- --  Depression Interventions/Treatment  -- -- -- -- Medication, Currently on Treatment Medication  Stress Interventions Offered  [employer], Provide Counseling  [Mom's health issues] Offered [employer], Provide Counseling -- Offered [employer], Provide Counseling -- --       Advanced Directives Status:  See Care Plan for related entries.  Care Plan                 Allergies  Allergen Reactions   Penicillins Anaphylaxis   Contrast Media [Iodinated Contrast Media] Other (See Comments)    unknown   Sulfa Antibiotics Rash    Medications Reviewed Today     Reviewed by Genice Rouge, MD (Physician) on 06/26/22 at [id number]  Med List Status: <None>   Medication Order Taking? Sig Documenting Provider Last Dose Status Informant  baclofen (LIORESAL) 10 MG tablet [id number] Yes TAKE 1/2 (ONE-HALF) TABLET BY MOUTH 4 TIMES DAILY FOR 7 DAYS THEN TAKE 1 TABLET 4 TIMES DAILY IF NEED BE, AFTER ANOTHER ONE WEEK, CAN INCREASE TO 1 & 1/2 (ONE & ONE-HALF) TABLETS 4 TIMES DAILY FOR GENERALIZED DYSTONIA [provider] Taking Active   clonazePAM (KLONOPIN) 1 MG tablet [id number] Yes Take 1 tablet (1 mg total) by mouth 2 (two) times daily. [name], [name], [name] Taking Active   dantrolene (DANTRIUM) 50 MG capsule [id number] Yes TAKE 1 CAPSULE BY MOUTH AT BEDTIME FOR 7 DAYS, THEN 1 CAPSULE TWICE DAILY FOR 7 DAYS, THEN 1 CAPSULE THREE TIMES DAILY FOR 7 DAYS, THEN 2 CAPSULES TWICE DAILY FOR DYSTONIA. THERE ARE NO OTHER MEDS THAT TREAT DYSTONIA THAN DANTROLENE AND KEPPRA. [provider] Taking Active   levETIRAcetam (KEPPRA) 250 MG tablet [id number] Yes Take by mouth. [provider] Taking Active   mirtazapine (REMERON) 7.5 MG tablet [id number] Yes Take 1 tablet (7.5 mg total) by mouth at bedtime. [name], [name], [name] Taking Active  ondansetron (ZOFRAN) 4 MG tablet [id number] Yes Take 1 tablet (4 mg total) by mouth every 8 (eight) hours as needed for nausea or vomiting. [name], [name], MD Taking Active   promethazine (PHENERGAN) 12.5 MG tablet [id number] Yes Take 1  tablet (12.5 mg total) by mouth every 6 (six) hours as needed for nausea or vomiting. [name], [name], MD Taking Active   sertraline (ZOLOFT) 50 MG tablet [id number] Yes Take 1 tablet (50 mg total) by mouth at bedtime. 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             Combatting depression may take some time.            If you don't feel better right away, don't give up on your treatment plan.    Current barriers:   Chronic Mental Health needs related to PTSD, depression, stress and anxiety. Patient requires Support, Education, Resources, Referrals, Advocacy, and Care Coordination, in order to meet Unmet Mental Health Needs. Patient has a diagnosis of Parkinson's Diease and movement disorder Patient will implement clinical interventions discussed today to decrease symptoms of depression and increase knowledge and/or ability of: coping skills. 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  Clinical Goal(s): verbalize understanding of plan for management of Anxiety, Depression, and Stress and demonstrate a reduction in symptoms. Patient will connect with a provider for ongoing mental health treatment, increase coping skills, healthy habits, self-management skills, and stress reduction        Clinical Interventions:  Assessed patient's previous and current treatment, coping skills, support system and barriers to care. Patient provided hx  Verbalization of feelings encouraged, motivational interviewing employed Emotional support provided, positive coping strategies explored. Establishing healthy boundaries emphasized and healthy self-care education provided Patient was educated on available mental health resources within their area that accept Medicaid and offer counseling and psychiatry. Patient is only interested in COUNSELING at this time. Patient physically wrote down these available mental health resources within her area that accept Medicaid and offer the services that she is interested in. Crisis resources were wrote down as well including the 988 number. Patient receives strong support from sister LCSW provided education on relaxation techniques such as meditation, deep breathing, massage, grounding exercises or yoga that can activate  the body's relaxation response and ease symptoms of stress and anxiety. LCSW ask that when pt is struggling with difficult emotions and racing thoughts that they start this relaxation response process. LCSW provided extensive education on healthy coping skills for anxiety. SW used active and reflective listening, validated patient's feelings/concerns, and provided emotional support. Patient will work on implementing appropriate self-care habits into their daily routine such as: staying positive, writing a gratitude list, drinking water, staying  active around the house, taking their medications as prescribed, combating negative thoughts or emotions and staying connected with their family and friends. Positive reinforcement provided for this decision to work on this. LCSW provided education on healthy sleep hygiene and what that looks like. LCSW encouraged patient to implement a night time routine into their schedule that works best for them and that they are able to maintain. Advised patient to implement deep breathing/grounding/meditation/self-care exercises into their nightly routine to combat racing thoughts at night. LCSW encouraged patient to wake up at the same time each day, make their sleeping environment comfortable, exercise when able, to limit naps and to not eat or drink anything right before bed.  Motivational Interviewing employed Depression screen reviewed  PHQ2/ PHQ9 completed or reviewed  Mindfulness or Relaxation training provided Active listening / Reflection utilized  Advance Care and HCPOA education provided Emotional Support Provided Problem Solving /Task Center strategies reviewed Provided psychoeducation for mental health needs  Provided brief CBT  Reviewed mental health medications and discussed importance of compliance:  Quality of sleep assessed & Sleep Hygiene techniques promoted  Participation in counseling encouraged  Verbalization of feelings encouraged  Suicidal Ideation/Homicidal Ideation assessed: Patient denies SI/HI  Review resources, discussed options and provided patient information about  Mental Health Resources Inter-disciplinary care team collaboration (see longitudinal plan of care) Update- Care coordination made with providers at [facility]. They do not have a female psychiatrist available and patient has past trauma and refuses to see a female provider based off this very important concern and barrier. Referral made for counseling only but wait list for therapy is up til mid July of  2024 per patient. [facility] LCSW update- Patient was recently put on some new psychiatric medicine Remeron and her zoloft was increased but patient reports no relief of symptoms just yet. [facility] LCSW provided education on medication management especially in regards to mental health medications. Patient was successfully scheduled for her initial therapy appointment at [facility] on 08/20/22. Patient is aware of her crisis support resources that she can utilize if needed while she waits to get established with a long term mental health provider. PCP updated. Patient is still deciding on whether on not she wishes to gain a psychiatry referral. UPDATE for 06/25/22 as well- Further care coordination took place between [facility] LCSW, PCP, [facility] RNCM and [facility] team to investigate patent's specific request for holistic approaches/resources supportive services for both her physical and mental health care needs. 07/16/22 update- Per recent pain management physician "Some of Psych meds changed- Changed Zoloft and increased it and increased Klonopin. Also added Remeron. Done last week." Patient's anxiety has increased due to patient's mother recently having a bad fall/broken hip that led to a current hospitalization. Extensive emotional support provided.  Patient Goals/Self-Care Activities: Over the next 120 days Attend scheduled medical appointments Utilize healthy coping skills and supportive resources discussed Contact PCP with any questions or concerns Keep 90 percent of counseling appointments Call your insurance provider for more information about your Enhanced  Benefits  Check out counseling resources provided  Begin personal counseling with LCSW, to reduce and manage symptoms of Depression and Stress, until well-established with mental health provider Accept all calls from mental health representatives in an effort to establish ongoing mental health counseling and supportive services. Incorporate into  daily practice - relaxation techniques, deep breathing exercises, and mindfulness meditation strategies. Talk about feelings with friends, family members, spiritual advisor, etc. Contact LCSW directly [phone]), if you have questions, need assistance, or if additional social work needs are identified between now and our next scheduled telephone outreach call. Call 988 for mental health hotline/crisis line if needed (24/7 available) Try techniques to reduce symptoms of anxiety/negative thinking (deep breathing, distraction, positive self talk, etc)  - develop a personal safety plan - develop a plan to deal with triggers like holidays, anniversaries - exercise at least 2 to 3 times per week - have a plan for how to handle bad days - journal feelings and what helps to feel better or worse - spend time or talk with others at least 2 to 3 times per week - watch for early signs of feeling worse - begin personal counseling - call and visit an old friend - check out volunteer opportunities - join a support group - laugh; watch a funny movie or comedian - learn and use visualization or guided imagery - perform a random act of kindness - practice relaxation or meditation daily - start or continue a personal journal - practice positive thinking and self-talk -continue with compliance of taking medication  -identify current effective and ineffective coping strategies.  -implement positive self-talk in care to increase self-esteem, confidence and feelings of control.  -consider alternative and complementary therapy approaches such as meditation, mindfulness or yoga.  -journaling, prayer, worship services, meditation or pastoral counseling.  -increase participation in pleasurable group activities such as hobbies, singing, sports or volunteering).  -consider the use of meditative movement therapy such as tai chi, yoga or qigong.  -start a regular daily exercise program based on tolerance, ability  and patient choice to support positive thinking and activity    If you are experiencing a Mental Health or Behavioral Health Crisis or need someone to talk to, please call the Suicide and Crisis Lifeline: 988    Patient Goals: Initial goal     24- Hour Availability:    [facility]  [address] [place], [place] [phone] Crisis [phone]   [facility] [phone]  [place] [facility]  [phone]    [facility]  [phone] (after hours)   Therapeutic Alternative/Mobile Crisis   [phone]   [place] National Suicide Hotline  (229) 687-3192 Len Childs) [place] 160   Call [phone] for mental health emergencies   [facility]  [phone]);  [place] and [employer]  [phone]); [place], [place], [place], [place], [place], [place], [place]    [place] [facility] for [facility] Residents For 24/7 walk-up access to mental health services for [facility] children (4+), adolescents and adults, please visit the [facility] located at [address] in [place], [place].  *[place] also provides comprehensive outpatient behavioral health services in a variety of locations around the [place].  Connect With [place] [address] [place], [place] [address] HelpLine: 670-070-0131 or 1-254-375-6173  Get Directions  Find Help 24/7 By Phone Call our 24-hour HelpLine at (906)766-2114 or (786) 140-2046 for immediate assistance for mental  health and substance abuse issues.  Walk-In Help [place]: [facility] (Ages 4 and Up) [place]: Emergency Dept., [facility] Additional Resources National Hopeline Network: 1-800-SUICIDE The National Suicide Prevention Lifeline: 1-800-273-TALK        06/26/2022    9:02 AM 05/24/2022    9:38 AM 03/15/2022    9:58 AM  12/14/2021    9:41 AM 09/07/2021    9:21 AM  Depression screen PHQ 2/9  Decreased Interest 3 0 0 2 2  Down, Depressed, Hopeless 3 3 0 3 3  PHQ - 2 Score 6 3 0 5 5  Altered sleeping  3  3 3   Tired, decreased energy  2  3 2   Change in appetite  2  3 2   Feeling bad or failure about yourself   3  2 3   Trouble concentrating  3  3 3   Moving slowly or fidgety/restless  2  3 1   Suicidal thoughts  0  0 0  PHQ-9 Score  18  22 19   Difficult doing work/chores  Extremely dIfficult  Very difficult Very difficult   Follow up:  Patient agrees to Care Plan and Follow-up.  Plan: The Managed Medicaid care management team will reach out to the patient again over the next 30 days.  Dickie La, BSW, MSW, [employer] Managed Medicaid LCSW [facility]  [facility] [email or web address] Phone: [phone]

## 2022-07-16 NOTE — Patient Instructions (Signed)
Visit Information  [name] was given information about Medicaid Managed Care team care coordination services as a part of their [facility] Community Plan Medicaid benefit. [name] verbally consented to engagement with the [facility] Managed Care team.   If you are experiencing a medical emergency, please call 911 or report to your local emergency department or urgent care.   If you have a non-emergency medical problem during routine business hours, please contact your provider's office and ask to speak with a nurse.   For questions related to your [facility], please call: [phone] or visit the homepage here: [email or web address]  If you would like to schedule transportation through your [facility], please call the following number at least 2 days in advance of your appointment: [phone]   Rides for urgent appointments can also be made after hours by calling Member Services.  Call the Behavioral Health Crisis Line at [phone], at any time, 24 hours a day, 7 days a week. If you are in danger or need immediate medical attention call 911.  If you would like help to quit smoking, call 1-800-QUIT-NOW (838-815-1472) OR Espaol: 1-855-Djelo-Ya (7-253-664-4034) o para ms informacin haga clic aqu or Text READY to 742-595 to register via text  10 LITTLE Things To Do When You're Feeling Too Down To Do Anything  Take a shower. Even if you plan to stay in all day long and not see a soul, take a shower. It takes the most effort to hop in to the shower but once you do, you'll feel immediate results. It will wake you up and you'll be feeling much fresher (and cleaner too).  Brush and floss your teeth. Give your teeth a good brushing with a floss finish. It's a small task but it feels so good and you can check 'taking care of your health' off the list of things to  do.  Do something small on your list. Most of [place] have some small thing we would like to get done (load of laundry, sew a button, email a friend). Doing one of these things will make you feel like you've accomplished something.  Drink water. Drinking water is easy right? It's also really beneficial for your health so keep a glass beside you all day and take sips often. It gives you energy and prevents you from boredom eating.  Do some floor exercises. The last thing you want to do is exercise but it might be just the thing you need the most. Keep it simple and do exercises that involve sitting or laying on the floor. Even the smallest of exercises release chemicals in the brain that make you feel good. Yoga stretches or core exercises are going to make you feel good with minimal effort.  Make your bed. Making your bed takes a few minutes but it's productive and you'll feel relieved when it's done. An unmade bed is a huge visual reminder that you're having an unproductive day. Do it and consider it your housework for the day.  Put on some nice clothes. Take the sweatpants off even if you don't plan to go anywhere. Put on clothes that make you feel good. Take a look in the mirror so your brain recognizes the sweatpants have been replaced with clothes that make you look great. It's an instant confidence booster.  Wash the dishes. A pile of dirty dishes in the sink is a reflection of your mood. It's possible that if you wash  up the dishes, your mood will follow suit. It's worth a try.  Cook a real meal. If you have the luxury to have a "do nothing" day, you have time to make a real meal for yourself. Make a meal that you love to eat. The process is good to get you out of the funk and the food will ensure you have more energy for tomorrow.  Write out your thoughts by hand. When you hand write, you stimulate your brain to focus on the moment that you're in so make yourself comfortable and write  whatever comes into your mind. Put those thoughts out on paper so they stop spinning around in your head. Those thoughts might be the very thing holding you down.     24- Hour Availability:    [facility]  [address] [place], [place] [phone] Crisis [phone]   [facility] [phone]  [place] [facility]  [phone]    [facility]  [phone] (after hours)   Therapeutic Alternative/Mobile Crisis   [phone]   [place] National Suicide Hotline  (270) 872-7830 Len Childs) [place] [address]   Call 857-375-9345 for mental health emergencies   [facility]  [phone]);  [place] and [employer]  [phone]); [place], [place], [place], [place], [place], [place], [place]    [place] [facility] Care for [facility] Residents For 24/7 walk-up access to mental health services for [facility] children (4+), adolescents and adults, please visit the [facility] located at [address] in [place], [place].  *[place] also provides comprehensive outpatient behavioral health services in a variety of locations around the [place].  Connect With [place] [address] [place], [place] [address] HelpLine: 5063686613 or 1-857-737-8659  Get Directions  Find Help 24/7 By Phone Call our 24-hour HelpLine at 302-779-0972 or 985-525-4873 for immediate assistance for mental health and substance abuse issues.  Walk-In Help [place]: [facility] (Ages 4 and Up) [place]: Emergency Dept., [facility] Additional Resources National Hopeline Network: 1-800-SUICIDE The National Suicide Prevention Lifeline: 830-877-1996

## 2022-07-24 ENCOUNTER — Telehealth: Payer: Self-pay | Admitting: Licensed Clinical Social Worker

## 2022-07-24 NOTE — Patient Outreach (Addendum)
Care Coordination  07/24/2022  [name] [birth year] [id number]  [facility] LCSW attempted to outreach patient in order to reschedule follow up appointment. Mailbox is full. [facility] LCSW received incoming return call with patient and was able to touch base with her and reschedule follow up appointment for 08/01/22 at 9 am.  Dickie La, BSW, MSW, LCSW Managed Medicaid LCSW [facility]  [facility] [email or web address] Phone: [phone]

## 2022-07-26 ENCOUNTER — Other Ambulatory Visit: Payer: Medicaid Other | Admitting: Obstetrics and Gynecology

## 2022-07-26 NOTE — Patient Instructions (Signed)
Hi Ms. Tabbert, I am sorry I missed you today,  as a part of your Medicaid benefit, you are eligible for care management and care coordination services at no cost or copay. I was unable to reach you by phone today but would be happy to help you with your health related needs. Please feel free to call me at [phone].  A member of the Managed Medicaid care management team will reach out to you again over the next 30 business  days.   Kathi Der RN, BSN [place]  [place] [facility], production - Managed Medicaid High Risk [phone]

## 2022-07-26 NOTE — Patient Outreach (Signed)
  Medicaid Managed Care   Unsuccessful Attempt Note   07/26/2022 Name: [name] MRN: [id number] DOB: Jun 08, 1965  Referred by: [name], [name], [name] Reason for referral : High Risk Managed Medicaid (Unsuccessful telephone outreach)  An unsuccessful telephone outreach was attempted today. The patient was referred to the case management team for assistance with care management and care coordination.    Follow Up Plan: The Managed Medicaid care management team will reach out to the patient again over the next 30 business  days. and The  Patient has been provided with contact information for the Managed Medicaid care management team and has been advised to call with any health related questions or concerns.   Kathi Der RN, BSN [place]  [place] [employer] - Managed Medicaid High Risk [phone]

## 2022-07-30 ENCOUNTER — Ambulatory Visit: Payer: Medicaid Other | Admitting: Licensed Clinical Social Worker

## 2022-08-01 ENCOUNTER — Other Ambulatory Visit: Payer: Medicaid Other | Admitting: Licensed Clinical Social Worker

## 2022-08-01 NOTE — Patient Instructions (Addendum)
Visit Information  [name] was given information about Medicaid Managed Care team care coordination services as a part of their [facility] Community [facility] Medicaid benefit. [name] verbally consented to engagement with the [facility] Managed Care team.   If you are experiencing a medical emergency, please call 911 or report to your local emergency department or urgent care.   If you have a non-emergency medical problem during routine business hours, please contact your provider's office and ask to speak with a nurse.   For questions related to your [facility], please call: [phone] or visit the homepage here: [email or web address]  If you would like to schedule transportation through your [facility], please call the following number at least 2 days in advance of your appointment: [phone]   Rides for urgent appointments can also be made after hours by calling Member Services.  Call the Behavioral Health Crisis Line at [phone], at any time, 24 hours a day, 7 days a week. If you are in danger or need immediate medical attention call 911.  If you would like help to quit smoking, call 1-800-QUIT-NOW (213-352-8031) OR Espaol: 1-855-Djelo-Ya (7-425-956-3875) o para ms informacin haga clic aqu or Text READY to 643-329 to register via text  The following coping skill education was provided for stress relief and mental health management: "When your car dies or a deadline looms, how do you respond? Long-term, low-grade or acute stress takes a serious toll on your body and mind, so don't ignore feelings of constant tension. Stress is a natural part of life. However, too much stress can harm our health, especially if it continues every day. This is chronic stress and can put you at risk for heart problems like heart disease and depression. Understand what's happening  inside your body and learn simple coping skills to combat the negative impacts of everyday stressors.  Types of Stress There are two types of stress: Emotional - types of emotional stress are relationship problems, pressure at work, financial worries, experiencing discrimination or having a major life change. Physical - Examples of physical stress include being sick having pain, not sleeping well, recovery from an injury or having an alcohol and drug use disorder. Fight or Flight Sudden or ongoing stress activates your nervous system and floods your bloodstream with adrenaline and cortisol, two hormones that raise blood pressure, increase heart rate and spike blood sugar. These changes pitch your body into a fight or flight response. That enabled our ancestors to outrun saber-toothed tigers, and it's helpful today for situations like dodging a car accident. But most modern chronic stressors, such as finances or a challenging relationship, keep your body in that heightened state, which hurts your health. Effects of Too Much Stress If constantly under stress, most of [place] will eventually start to function less well.  Multiple studies link chronic stress to a higher risk of heart disease, stroke, depression, weight gain, memory loss and even premature death, so it's important to recognize the warning signals. Talk to your doctor about ways to manage stress if you're experiencing any of these symptoms: Prolonged periods of poor sleep. Regular, severe headaches. Unexplained weight loss or gain. Feelings of isolation, withdrawal or worthlessness. Constant anger and irritability. Loss of interest in activities. Constant worrying or obsessive thinking. Excessive alcohol or drug use. Inability to concentrate.  10 Ways to Cope with Chronic Stress It's key to recognize stressful situations as they occur because it allows you to focus on  managing how you react. We all need to know when to close our eyes and  take a deep breath when we feel tension rising. Use these tips to prevent or reduce chronic stress. 1. Rebalance Work and Home All work and no play? If you're spending too much time at the office, intentionally put more dates in your calendar to enjoy time for fun, either alone or with others. 2. Get Regular Exercise Moving your body on a regular basis balances the nervous system and increases blood circulation, helping to flush out stress hormones. Even a daily 20-minute walk makes a difference. Any kind of exercise can lower stress and improve your mood ? just pick activities that you enjoy and make it a regular habit. 3. Eat Well and Limit Alcohol and Stimulants Alcohol, nicotine and caffeine may temporarily relieve stress but have negative health impacts and can make stress worse in the long run. Well-nourished bodies cope better, so start with a good breakfast, add more organic fruits and vegetables for a well-balanced diet, avoid processed foods and sugar, try herbal tea and drink more water. 4. Connect with Supportive People Talking face to face with another person releases hormones that reduce stress. Lean on those good listeners in your life. 5. Carve Out Hobby Time Do you enjoy gardening, reading, listening to music or some other creative pursuit? Engage in activities that bring you pleasure and joy; research shows that reduces stress by almost half and lowers your heart rate, too. 6. Practice Meditation, Stress Reduction or Yoga Relaxation techniques activate a state of restfulness that counterbalances your body's fight-or-flight hormones. Even if this also means a 10-minute break in a long day: listen to music, read, go for a walk in nature, do a hobby, take a bath or spend time with a friend. Also consider doing a mindfulness exercise or try a daily deep breathing or imagery practice. Deep Breathing Slow, calm and deep breathing can help you relax. Try these steps to focus on your  breathing and repeat as needed. Find a comfortable position and close your eyes. Exhale and drop your shoulders. Breathe in through your nose; fill your lungs and then your belly. Think of relaxing your body, quieting your mind and becoming calm and peaceful. Breathe out slowly through your nose, relaxing your belly. Think of releasing tension, pain, worries or distress. Repeat steps three and four until you feel relaxed. Imagery This involves using your mind to excite the senses -- sound, vision, smell, taste and feeling. This may help ease your stress. Begin by getting comfortable and then do some slow breathing. Imagine a place you love being at. It could be somewhere from your childhood, somewhere you vacationed or just a place in your imagination. Feel how it is to be in the place you're imagining. Pay attention to the sounds, air, colors, and who is there with you. This is a place where you feel cared for and loved. All is well. You are safe. Take in all the smells, sounds, tastes and feelings. As you do, feel your body being nourished and healed. Feel the calm that surrounds you. Breathe in all the good. Breathe out any discomfort or tension. 7. Sleep Enough If you get less than seven to eight hours of sleep, your body won't tolerate stress as well as it could. If stress keeps you up at night, address the cause, and add extra meditation into your day to make up for the lost z's. Try to get seven to nine  hours of sleep each night. Make a regular bedtime schedule. Keep your room dark and cool. Try to avoid computers, TV, cell phones and tablets before bed. 8. Bond with Connections You Enjoy Go out for a coffee with a friend, chat with a neighbor, call a family member, visit with a clergy member, or even hang out with your pet. Clinical studies show that spending even a short time with a companion animal can cut anxiety levels almost in half. 9. Take a Vacation Getting away from it all can  reset your stress tolerance by increasing your mental and emotional outlook, which makes you a happier, more productive person upon return. Leave your cellphone and laptop at home! 10. See a Counselor, Coach or Therapist If negative thoughts overwhelm your ability to make positive changes, it's time to seek professional help. Make an appointment today--your health and life are worth it."     24- Hour Availability:    [facility]  [address] [place], [place] [phone] Crisis [phone]   [facility] [phone]  [place] [facility]  [phone]    [facility]  [phone] (after hours)   Therapeutic Alternative/Mobile Crisis   [phone]   [place] National Suicide Hotline  343-017-0689 Len Childs) [place] [address]   Call [phone] for mental health emergencies   [facility]  [phone]);  [place] and [employer]  [phone]); [place], [place], [place], [place], [place], [place], [place]    [place] [facility] for [facility] Residents For 24/7 walk-up access to mental health services for [facility] children (4+), adolescents and adults, please visit the [facility] located at [address] in [place], [place].  *[facility] also provides comprehensive outpatient behavioral health services in a variety of locations around the [place].  Connect With [place] [address] [place], [place] [address] HelpLine: (563)427-7414 or 1-2797491441  Get Directions  Find Help 24/7 By Phone Call our 24-hour HelpLine at 770-136-8251 or 860-728-8018 for immediate assistance for mental health and substance abuse issues.  Walk-In Help [place]: [facility] (Ages 4 and Up) [place]: Emergency Dept., [facility] Additional Resources National Hopeline Network:  1-800-SUICIDE The National Suicide Prevention Lifeline: 1-800-273-TALK                  Dickie La, BSW, MSW, LCSW Managed Medicaid LCSW [facility]  [facility] [email or web address] Phone: [phone]

## 2022-08-01 NOTE — Patient Outreach (Addendum)
Medicaid Managed Care Social Work Note  08/01/2022 Name:  [name] MRN:  [id number] DOB:  1966-01-11  [name] is an 57 y.o. year old female who is a primary patient of [name], [name], [name].  The Medicaid Managed Care Coordination team was consulted for assistance with:  Mental Health Counseling and Resources  [name] was given information about Medicaid Managed Care Coordination team services today. [name] Patient agreed to services and verbal consent obtained.  Engaged with patient  for by telephone forfollow up visit in response to referral for case management and/or care coordination services.   Assessments/Interventions:  Review of past medical history, allergies, medications, health status, including review of consultants reports, laboratory and other test data, was performed as part of comprehensive evaluation and provision of chronic care management services.  SDOH: (Social Determinant of Health) assessments and interventions performed: SDOH Interventions    Flowsheet Row Patient Outreach Telephone from 08/01/2022 in [place] POPULATION HEALTH DEPARTMENT Patient Outreach Telephone from 07/16/2022 in [place] POPULATION HEALTH DEPARTMENT Patient Outreach Telephone from 06/25/2022 in [place] POPULATION HEALTH DEPARTMENT Patient Outreach Telephone from 06/24/2022 in [place] POPULATION HEALTH DEPARTMENT Patient Outreach Telephone from 06/03/2022 in  POPULATION HEALTH DEPARTMENT Office Visit from 05/24/2022 in  [facility]  SDOH Interventions        Food Insecurity Interventions -- -- -- Intervention Not Indicated -- --  Housing Interventions -- -- -- -- Intervention Not Indicated --  Transportation Interventions -- -- -- Intervention Not Indicated -- --  Utilities Interventions -- -- -- Intervention Not Indicated -- --  Depression Interventions/Treatment  -- -- -- -- -- Medication, Currently on Treatment  Stress  Interventions Offered [name], Provide Counseling  [Pt's mother recently left hospital and is now at a nursing home which has triggered her anxiety] [name], Provide Counseling  [Mom's health issues] Offered [name], Provide Counseling -- [name], Provide Counseling --       Advanced Directives Status:  See Care Plan for related entries.  Care Plan                 Allergies  Allergen Reactions   Penicillins Anaphylaxis   Contrast Media [Iodinated Contrast Media] Other (See Comments)    unknown   Sulfa Antibiotics Rash    Medications Reviewed Today     Reviewed by Genice Rouge, MD (Physician) on 06/26/22 at [phone]  Med List Status: <None>   Medication Order Taking? Sig Documenting Provider Last Dose Status Informant  baclofen (LIORESAL) 10 MG tablet [id number] Yes TAKE 1/2 (ONE-HALF) TABLET BY MOUTH 4 TIMES DAILY FOR 7 DAYS THEN TAKE 1 TABLET 4 TIMES DAILY IF NEED BE, AFTER ANOTHER ONE WEEK, CAN INCREASE TO 1 & 1/2 (ONE & ONE-HALF) TABLETS 4 TIMES DAILY FOR GENERALIZED DYSTONIA [provider] Taking Active   clonazePAM (KLONOPIN) 1 MG tablet [id number] Yes Take 1 tablet (1 mg total) by mouth 2 (two) times daily. [name], [name], [name] Taking Active   dantrolene (DANTRIUM) 50 MG capsule [id number] Yes TAKE 1 CAPSULE BY MOUTH AT BEDTIME FOR 7 DAYS, THEN 1 CAPSULE TWICE DAILY FOR 7 DAYS, THEN 1 CAPSULE THREE TIMES DAILY FOR 7 DAYS, THEN 2 CAPSULES TWICE DAILY FOR DYSTONIA. THERE ARE NO OTHER MEDS THAT TREAT DYSTONIA THAN DANTROLENE AND KEPPRA. [provider] Taking Active   levETIRAcetam (KEPPRA) 250 MG tablet [id number] Yes Take by mouth. [provider] Taking Active  mirtazapine (REMERON) 7.5 MG tablet [id number] Yes Take 1 tablet (7.5 mg total) by mouth at bedtime. [name], [name], [name] Taking Active   ondansetron (ZOFRAN) 4 MG tablet [id number] Yes Take 1 tablet  (4 mg total) by mouth every 8 (eight) hours as needed for nausea or vomiting. [name], [name], MD Taking Active   promethazine (PHENERGAN) 12.5 MG tablet [id number] Yes Take 1 tablet (12.5 mg total) by mouth every 6 (six) hours as needed for nausea or vomiting. [name], [name], MD Taking Active   sertraline (ZOLOFT) 50 MG tablet [id number] Yes Take 1 tablet (50 mg total) by mouth at bedtime. 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             Combatting depression may take some time.            If you don't feel better right away, don't give up on your treatment plan.    Current barriers:   Chronic Mental Health needs related to PTSD, depression, stress and anxiety. Patient requires Support, Education, Resources, Referrals, Advocacy, and Care Coordination, in order to meet Unmet Mental Health Needs. Patient has a diagnosis of Parkinson's Diease and movement disorder Patient will implement clinical interventions discussed today to decrease symptoms of  depression and increase knowledge and/or ability of: coping skills. 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  Clinical Goal(s): verbalize understanding of plan for management of Anxiety, Depression, and Stress and demonstrate a reduction in symptoms. Patient will connect with a provider for ongoing mental health treatment, increase coping skills, healthy habits, self-management skills, and stress reduction        Clinical Interventions:  Assessed patient's previous and current treatment, coping skills, support system and barriers to care. Patient provided hx  Verbalization of feelings encouraged, motivational interviewing employed Emotional support provided, positive coping strategies explored. Establishing healthy boundaries emphasized and healthy self-care education provided Patient was educated on available mental health resources within their area that accept Medicaid and offer counseling and psychiatry. Patient is only interested in COUNSELING at this time. Patient physically wrote down these available mental health resources within her area that accept Medicaid and offer the services that she is interested in. Crisis resources were wrote down as well including the 988 number.  Patient receives strong support from sister LCSW provided education on relaxation techniques such as meditation, deep breathing, massage, grounding exercises or yoga that can activate the body's relaxation response and ease symptoms of stress and anxiety. LCSW ask that when pt is struggling with difficult emotions and racing thoughts that they start this relaxation response process. LCSW provided extensive education on healthy coping skills for anxiety. SW used active and reflective listening, validated patient's feelings/concerns, and provided emotional  support. Patient will work on implementing appropriate self-care habits into their daily routine such as: staying positive, writing a gratitude list, drinking water, staying active around the house, taking their medications as prescribed, combating negative thoughts or emotions and staying connected with their family and friends. Positive reinforcement provided for this decision to work on this. LCSW provided education on healthy sleep hygiene and what that looks like. LCSW encouraged patient to implement a night time routine into their schedule that works best for them and that they are able to maintain. Advised patient to implement deep breathing/grounding/meditation/self-care exercises into their nightly routine to combat racing thoughts at night. LCSW encouraged patient to wake up at the same time each day, make their sleeping environment comfortable, exercise when able, to limit naps and to not eat or drink anything right before bed.  Motivational Interviewing employed Depression screen reviewed  PHQ2/ PHQ9 completed or reviewed  Mindfulness or Relaxation training provided Active listening / Reflection utilized  Advance Care and HCPOA education provided Emotional Support Provided Problem Solving /Task Center strategies reviewed Provided psychoeducation for mental health needs  Provided brief CBT  Reviewed mental health medications and discussed importance of compliance:  Quality of sleep assessed & Sleep Hygiene techniques promoted  Participation in counseling encouraged  Verbalization of feelings encouraged  Suicidal Ideation/Homicidal Ideation assessed: Patient denies SI/HI  Review resources, discussed options and provided patient information about  Mental Health Resources Inter-disciplinary care team collaboration (see longitudinal plan of care) Update- Care coordination made with providers at [facility] [place]. They do not have a female psychiatrist available and patient has past  trauma and refuses to see a female provider based off this very important concern and barrier. Referral made for counseling only but wait list for therapy is up til mid July of 2024 per patient. [facility] LCSW update- Patient was recently put on some new psychiatric medicine Remeron and her zoloft was increased but patient reports no relief of symptoms just yet. [facility] LCSW provided education on medication management especially in regards to mental health medications. Patient was successfully scheduled for her initial therapy appointment at [facility] [place] on 08/20/22. Patient is aware of her crisis support resources that she can utilize if needed while she waits to get established with a long term mental health provider. PCP updated. Patient is still deciding on whether on not she wishes to gain a psychiatry referral. UPDATE for 06/25/22 as well- Further care coordination took place between [facility] LCSW, PCP, [facility] RNCM and [facility] team to investigate patent's specific request for holistic approaches/resources supportive services for both her physical and mental health care needs. 07/16/22 update- Per recent pain management physician "Some of Psych meds changed- Changed Zoloft and increased it and increased Klonopin. Also added Remeron. Done last week." Patient's anxiety has increased due to patient's mother recently having a bad fall/broken hip that led to a current hospitalization. Extensive emotional support provided. 08/01/22- Patient reports stress due to her mother's ongoing health issues. Her mother discharged from the hospital and is  now at a nursing home. Patient has even missed medical appointments due her mother's ongoing crisis. Patient was provided emotional support and stress management coping education. Advised patient to exercise 10-20 minutes everyday, go outside in the sunlight everyday for at least 10-15 minutes, drink 6-8 glasses of water a day, take vitamins/medications as directed and try to  increase sleep.   Patient Goals/Self-Care Activities: Over the next 120 days Attend scheduled medical appointments Utilize healthy coping skills and supportive resources discussed Contact PCP with any questions or concerns Keep 90 percent of counseling appointments Call your insurance provider for more information about your Enhanced Benefits  Check out counseling resources provided  Begin personal counseling with LCSW, to reduce and manage symptoms of Depression and Stress, until well-established with mental health provider Accept all calls from mental health representatives in an effort to establish ongoing mental health counseling and supportive services. Incorporate into daily practice - relaxation techniques, deep breathing exercises, and mindfulness meditation strategies. Talk about feelings with friends, family members, spiritual advisor, etc. Contact LCSW directly [phone]), if you have questions, need assistance, or if additional social work needs are identified between now and our next scheduled telephone outreach call. Call 988 for mental health hotline/crisis line if needed (24/7 available) Try techniques to reduce symptoms of anxiety/negative thinking (deep breathing, distraction, positive self talk, etc)  - develop a personal safety plan - develop a plan to deal with triggers like holidays, anniversaries - exercise at least 2 to 3 times per week - have a plan for how to handle bad days - journal feelings and what helps to feel better or worse - spend time or talk with others at least 2 to 3 times per week - watch for early signs of feeling worse - begin personal counseling - call and visit an old friend - check out volunteer opportunities - join a support group - laugh; watch a funny movie or comedian - learn and use visualization or guided imagery - perform a random act of kindness - practice relaxation or meditation daily - start or continue a personal journal -  practice positive thinking and self-talk -continue with compliance of taking medication  -identify current effective and ineffective coping strategies.  -implement positive self-talk in care to increase self-esteem, confidence and feelings of control.  -consider alternative and complementary therapy approaches such as meditation, mindfulness or yoga.  -journaling, prayer, worship services, meditation or pastoral counseling.  -increase participation in pleasurable group activities such as hobbies, singing, sports or volunteering).  -consider the use of meditative movement therapy such as tai chi, yoga or qigong.  -start a regular daily exercise program based on tolerance, ability and patient choice to support positive thinking and activity    If you are experiencing a Mental Health or Behavioral Health Crisis or need someone to talk to, please call the Suicide and Crisis Lifeline: 988    Patient Goals: Initial goal     24- Hour Availability:    [facility]  [address] [place], [place] [phone] Crisis [phone]   [facility] [phone]  [place] [facility]  [phone]    [facility]  [phone] (after hours)   Therapeutic Alternative/Mobile Crisis   [phone]   [place] National Suicide Hotline  616-304-2641 Len Child[place]) [place] 151   Call 709-329-0159 for mental health emergencies   [facility]  [phone]);  [place] and [employer]  [phone]); [place],  [place], [place], [place], [place], [place]    [facility] for [facility] Residents For 24/7 walk-up access to mental health services for [facility] children (4+), adolescents and adults, please visit the [facility] located at [address] in [place], [place].  *[facility] also provides comprehensive outpatient behavioral health  services in a variety of locations around the [place].  Connect With [place] [address] [place], [place] [address] HelpLine: 559-443-8407 or 1-(864)069-3336  Get Directions  Find Help 24/7 By Phone Call our 24-hour HelpLine at 639-543-7701 or (218)678-7742 for immediate assistance for mental health and substance abuse issues.  Walk-In Help [place]: [facility] (Ages 4 and Up) [place]: Emergency Dept., [facility] Additional Resources National Hopeline Network: 1-800-SUICIDE The National Suicide Prevention Lifeline: 1-800-273-TALK        06/26/2022    9:02 AM 05/24/2022    9:38 AM 03/15/2022    9:58 AM 12/14/2021    9:41 AM 09/07/2021    9:21 AM  Depression screen PHQ 2/9  Decreased Interest 3 0 0 2 2  Down, Depressed, Hopeless 3 3 0 3 3  PHQ - 2 Score 6 3 0 5 5  Altered sleeping  3  3 3   Tired, decreased energy  2  3 2   Change in appetite  2  3 2   Feeling bad or failure about yourself   3  2 3   Trouble concentrating  3  3 3   Moving slowly or fidgety/restless  2  3 1   Suicidal thoughts  0  0 0  PHQ-9 Score  18  22 19   Difficult doing work/chores  Extremely dIfficult  Very difficult Very difficult    Follow up:  Patient agrees to Care Plan and Follow-up.  Plan: The Managed Medicaid care management team will reach out to the patient again over the next 30 days.  Dickie La, BSW, MSW, [employer] Managed Medicaid LCSW [facility]  [facility] [email or web address] Phone: [phone]

## 2022-08-12 ENCOUNTER — Encounter: Payer: Medicaid Other | Admitting: Physical Medicine and Rehabilitation

## 2022-08-12 ENCOUNTER — Ambulatory Visit: Payer: Medicaid Other | Admitting: Physical Medicine and Rehabilitation

## 2022-08-14 ENCOUNTER — Other Ambulatory Visit: Payer: Medicaid Other | Admitting: Obstetrics and Gynecology

## 2022-08-14 NOTE — Patient Instructions (Signed)
Hi Ms. Froh, I am sorry we have missed you again, as a part of your Medicaid benefit, you are eligible for care management and care coordination services at no cost or copay. I was unable to reach you by phone today but would be happy to help you with your health related needs. Please feel free to call me at [phone].  A member of the Managed Medicaid care management team will reach out to you again over the next 30 business  days.   Kathi Der RN, BSN [place]  [place] [employer], production - Managed Medicaid High Risk [phone]

## 2022-08-14 NOTE — Patient Outreach (Signed)
  Medicaid Managed Care   Unsuccessful Attempt Note   08/14/2022 Name: [name] MRN: [id number] DOB: 12/23/1965  Referred by: [name], [name], [name] Reason for referral : High Risk Managed Medicaid (Unsuccessful telephone outreach)  A second unsuccessful telephone outreach was attempted today. The patient was referred to the case management team for assistance with care management and care coordination.    Follow Up Plan: The Managed Medicaid care management team will reach out to the patient again over the next 30 business  days. and The  Patient has been provided with contact information for the Managed Medicaid care management team and has been advised to call with any health related questions or concerns.   Kathi Der RN, BSN [place]  [place] Engineer, production - Managed Medicaid High Risk [phone]

## 2022-08-19 ENCOUNTER — Other Ambulatory Visit: Payer: Medicaid Other | Admitting: Licensed Clinical Social Worker

## 2022-08-19 NOTE — Patient Outreach (Signed)
Medicaid Managed Care Social Work Note  08/19/2022 Name:  INAYA GILLHAM MRN:  [id number] DOB:  [birth year]  CHRISTEL BAI is an 57 y.o. year old female who is a primary patient of [name], [name], [name].  The Medicaid Managed Care Coordination team was consulted for assistance with:  Mental Health Counseling and Resources  Ms. Bluett was given information about Medicaid Managed Care Coordination team services today. [name] Patient agreed to services and verbal consent obtained.  Engaged with patient  for by telephone forfollow up visit in response to referral for case management and/or care coordination services.   Assessments/Interventions:  Review of past medical history, allergies, medications, health status, including review of consultants reports, laboratory and other test data, was performed as part of comprehensive evaluation and provision of chronic care management services.  SDOH: (Social Determinant of Health) assessments and interventions performed: SDOH Interventions    Flowsheet Row Patient Outreach Telephone from 08/19/2022 in [place] POPULATION HEALTH DEPARTMENT Patient Outreach Telephone from 08/01/2022 in [place] POPULATION HEALTH DEPARTMENT Patient Outreach Telephone from 07/16/2022 in [place] POPULATION HEALTH DEPARTMENT Patient Outreach Telephone from 06/25/2022 in [place] POPULATION HEALTH DEPARTMENT Patient Outreach Telephone from 06/24/2022 in [place] POPULATION HEALTH DEPARTMENT Patient Outreach Telephone from 06/03/2022 in [place] POPULATION HEALTH DEPARTMENT  SDOH Interventions        Food Insecurity Interventions -- -- -- -- Intervention Not Indicated --  Housing Interventions -- -- -- -- -- Intervention Not Indicated  Transportation Interventions -- -- -- -- Intervention Not Indicated --  Utilities Interventions -- -- -- -- Intervention Not Indicated --  Stress Interventions Offered [employer], Provide  Counseling Offered [employer], Provide Counseling  [Pt's mother recently left hospital and is now at a nursing home which has triggered her anxiety] [employer], Provide Counseling  [Mom's health issues] Offered [employer], Provide Counseling -- [employer], Provide Counseling       Advanced Directives Status:  See Care Plan for related entries.  Care Plan                 Allergies  Allergen Reactions   Penicillins Anaphylaxis   Contrast Media [Iodinated Contrast Media] Other (See Comments)    unknown   Sulfa Antibiotics Rash    Medications Reviewed Today     Reviewed by Genice Rouge, MD (Physician) on 06/26/22 at [phone]  Med List Status: <None>   Medication Order Taking? Sig Documenting Provider Last Dose Status Informant  baclofen (LIORESAL) 10 MG tablet [id number] Yes TAKE 1/2 (ONE-HALF) TABLET BY MOUTH 4 TIMES DAILY FOR 7 DAYS THEN TAKE 1 TABLET 4 TIMES DAILY IF NEED BE, AFTER ANOTHER ONE WEEK, CAN INCREASE TO 1 & 1/2 (ONE & ONE-HALF) TABLETS 4 TIMES DAILY FOR GENERALIZED DYSTONIA [provider] Taking Active   clonazePAM (KLONOPIN) 1 MG tablet [id number] Yes Take 1 tablet (1 mg total) by mouth 2 (two) times daily. [name], [name], [name] Taking Active   dantrolene (DANTRIUM) 50 MG capsule [id number] Yes TAKE 1 CAPSULE BY MOUTH AT BEDTIME FOR 7 DAYS, THEN 1 CAPSULE TWICE DAILY FOR 7 DAYS, THEN 1 CAPSULE THREE TIMES DAILY FOR 7 DAYS, THEN 2 CAPSULES TWICE DAILY FOR DYSTONIA. THERE ARE NO OTHER MEDS THAT TREAT DYSTONIA THAN DANTROLENE AND KEPPRA. [provider] Taking Active   levETIRAcetam (KEPPRA) 250 MG tablet [id number] Yes Take by mouth. [provider] Taking Active   mirtazapine (REMERON) 7.5 MG  tablet [id number] Yes Take 1 tablet (7.5 mg total) by mouth at bedtime. [name], [name], [name] Taking Active   ondansetron (ZOFRAN) 4 MG tablet [id number] Yes Take 1 tablet (4 mg  total) by mouth every 8 (eight) hours as needed for nausea or vomiting. [name], [name], MD Taking Active   promethazine (PHENERGAN) 12.5 MG tablet [id number] Yes Take 1 tablet (12.5 mg total) by mouth every 6 (six) hours as needed for nausea or vomiting. [name], [name], MD Taking Active   sertraline (ZOLOFT) 50 MG tablet [id number] Yes Take 1 tablet (50 mg total) by mouth at bedtime. 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             Combatting depression may take some time.            If you don't feel better right away, don't give up on your treatment plan.    Current barriers:   Chronic Mental Health needs related to PTSD, depression, stress and anxiety. Patient requires Support, Education, Resources, Referrals, Advocacy, and Care Coordination, in order to meet Unmet Mental Health Needs. Patient has a diagnosis of Parkinson's Diease and movement disorder Patient will implement clinical interventions discussed today to decrease symptoms of depression and increase  knowledge and/or ability of: coping skills. 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  Clinical Goal(s): verbalize understanding of plan for management of Anxiety, Depression, and Stress and demonstrate a reduction in symptoms. Patient will connect with a provider for ongoing mental health treatment, increase coping skills, healthy habits, self-management skills, and stress reduction        Clinical Interventions:  Assessed patient's previous and current treatment, coping skills, support system and barriers to care. Patient provided hx  Verbalization of feelings encouraged, motivational interviewing employed Emotional support provided, positive coping strategies explored. Establishing healthy boundaries emphasized and healthy self-care education provided Patient was educated on available mental health resources within their area that accept Medicaid and offer counseling and psychiatry. Patient is only interested in COUNSELING at this time. Patient physically wrote down these available mental health resources within her area that accept Medicaid and offer the services that she is interested in. Crisis resources were wrote down as well including the 988 number. Patient receives strong support from sister  LCSW provided education on relaxation techniques such as meditation, deep breathing, massage, grounding exercises or yoga that can activate the body's relaxation response and ease symptoms of stress and anxiety. LCSW ask that when pt is struggling with difficult emotions and racing thoughts that they start this relaxation response process. LCSW provided extensive education on healthy coping skills for anxiety. SW used active and reflective listening, validated patient's feelings/concerns, and provided emotional support. Patient will  work on implementing appropriate self-care habits into their daily routine such as: staying positive, writing a gratitude list, drinking water, staying active around the house, taking their medications as prescribed, combating negative thoughts or emotions and staying connected with their family and friends. Positive reinforcement provided for this decision to work on this. LCSW provided education on healthy sleep hygiene and what that looks like. LCSW encouraged patient to implement a night time routine into their schedule that works best for them and that they are able to maintain. Advised patient to implement deep breathing/grounding/meditation/self-care exercises into their nightly routine to combat racing thoughts at night. LCSW encouraged patient to wake up at the same time each day, make their sleeping environment comfortable, exercise when able, to limit naps and to not eat or drink anything right before bed.  Motivational Interviewing employed Depression screen reviewed  PHQ2/ PHQ9 completed or reviewed  Mindfulness or Relaxation training provided Active listening / Reflection utilized  Advance Care and HCPOA education provided Emotional Support Provided Problem Solving /Task Center strategies reviewed Provided psychoeducation for mental health needs  Provided brief CBT  Reviewed mental health medications and discussed importance of compliance:  Quality of sleep assessed & Sleep Hygiene techniques promoted  Participation in counseling encouraged  Verbalization of feelings encouraged  Suicidal Ideation/Homicidal Ideation assessed: Patient denies SI/HI  Review resources, discussed options and provided patient information about  Mental Health Resources Inter-disciplinary care team collaboration (see longitudinal plan of care) Update- Care coordination made with providers at [facility]. They do not have a female psychiatrist available and patient has past trauma and refuses to  see a female provider based off this very important concern and barrier. Referral made for counseling only but wait list for therapy is up til mid July of 2024 per patient. [facility] LCSW update- Patient was recently put on some new psychiatric medicine Remeron and her zoloft was increased but patient reports no relief of symptoms just yet. [facility] LCSW provided education on medication management especially in regards to mental health medications. Patient was successfully scheduled for her initial therapy appointment at [facility] [place] on 08/20/22. Patient is aware of her crisis support resources that she can utilize if needed while she waits to get established with a long term mental health provider. PCP updated. Patient is still deciding on whether on not she wishes to gain a psychiatry referral. UPDATE for 06/25/22 as well- Further care coordination took place between [facility] LCSW, PCP, [facility] RNCM and [facility] team to investigate patent's specific request for holistic approaches/resources supportive services for both her physical and mental health care needs. 07/16/22 update- Per recent pain management physician "Some of Psych meds changed- Changed Zoloft and increased it and increased Klonopin. Also added Remeron. Done last week." Patient's anxiety has increased due to patient's mother recently having a bad fall/broken hip that led to a current hospitalization. Extensive emotional support provided. 08/01/22- Patient reports stress due to her mother's ongoing health issues. Her mother discharged from the hospital and is now at a nursing home. Patient  has even missed medical appointments due her mother's ongoing crisis. Patient was provided emotional support and stress management coping education. Advised patient to exercise 10-20 minutes everyday, go outside in the sunlight everyday for at least 10-15 minutes, drink 6-8 glasses of water a day, take vitamins/medications as directed and try to increase sleep. Update-  Patient is in chronic pain and it has gotten worse since her mother's health has declined as she is the primary caregiver. Patient reports that she is struggling putting boundaries in place with her caregiving responsibilities. Patient had a to cancel her own medical appointment last week due to health issues with her mother. Patient reports that her [facility] and called her to come pick her and she did and since this transition back home, both her own and her mother's health have suffered. Emotional support and self-care education provided.   Patient Goals/Self-Care Activities: Over the next 120 days Attend scheduled medical appointments Utilize healthy coping skills and supportive resources discussed Contact PCP with any questions or concerns Keep 90 percent of counseling appointments Call your insurance provider for more information about your Enhanced Benefits  Check out counseling resources provided  Begin personal counseling with LCSW, to reduce and manage symptoms of Depression and Stress, until well-established with mental health provider Accept all calls from mental health representatives in an effort to establish ongoing mental health counseling and supportive services. Incorporate into daily practice - relaxation techniques, deep breathing exercises, and mindfulness meditation strategies. Talk about feelings with friends, family members, spiritual advisor, etc. Contact LCSW directly [phone]), if you have questions, need assistance, or if additional social work needs are identified between now and our next scheduled telephone outreach call. Call 988 for mental health hotline/crisis line if needed (24/7 available) Try techniques to reduce symptoms of anxiety/negative thinking (deep breathing, distraction, positive self talk, etc)  - develop a personal safety plan - develop a plan to deal with triggers like holidays, anniversaries -  exercise at least 2 to 3 times per week - have a plan for how to handle bad days - journal feelings and what helps to feel better or worse - spend time or talk with others at least 2 to 3 times per week - watch for early signs of feeling worse - begin personal counseling - call and visit an old friend - check out volunteer opportunities - join a support group - laugh; watch a funny movie or comedian - learn and use visualization or guided imagery - perform a random act of kindness - practice relaxation or meditation daily - start or continue a personal journal - practice positive thinking and self-talk -continue with compliance of taking medication  -identify current effective and ineffective coping strategies.  -implement positive self-talk in care to increase self-esteem, confidence and feelings of control.  -consider alternative and complementary therapy approaches such as meditation, mindfulness or yoga.  -journaling, prayer, worship services, meditation or pastoral counseling.  -increase participation in pleasurable group activities such as hobbies, singing, sports or volunteering).  -consider the use of meditative movement therapy such as tai chi, yoga or qigong.  -start a regular daily exercise program based on tolerance, ability and patient choice to support positive thinking and activity    If you are experiencing a Mental Health or Behavioral Health Crisis or need someone to talk to, please call the Suicide and Crisis Lifeline: 988    Patient Goals: Initial goal     24- Hour Availability:  [facility]  [address] [place], [place] [phone] Crisis [phone]   [facility] [phone]  [place] [facility]  [phone]    [facility]  [phone] (after hours)   Therapeutic Alternative/Mobile Crisis   [phone]   [place] National Suicide Hotline  615-430-5868  Len Childs) [place] [address]   Call 647-423-8386 for mental health emergencies   [facility]  [phone]);  [place] and [employer]  ([phone])[phone]); [place], [place], [place], [place], [place], [place], [place]    [place] [facility] for [facility] Residents For 24/7 walk-up access to mental health services for [facility] children (4+), adolescents and adults, please visit the [facility] located at [address] in [place], [place].  *[place] also provides comprehensive outpatient behavioral health services in a variety of locations around the [place].  Connect With [place] [address] [place], [place] [address] HelpLine: 9108178344 or 1-680-830-4441  Get Directions  Find Help 24/7 By Phone Call our 24-hour HelpLine at (628)675-1353 or (360) 632-1649 for immediate assistance for mental health and substance abuse issues.  Walk-In Help [place]: [facility] (Ages 4 and Up) [place]: Emergency Dept., [facility] Additional Resources National Hopeline Network: 1-800-SUICIDE The National Suicide Prevention Lifeline: 1-800-273-TALK      Follow up:  Patient agrees to Care Plan and Follow-up.  Plan: The Managed Medicaid care management team will reach out to the patient again over the next 30 days.  Dickie La, BSW, MSW, [employer] Managed Medicaid LCSW [facility]  [facility] [email or web address] Phone: [phone]

## 2022-08-19 NOTE — Patient Instructions (Signed)
Visit Information  Ms. Weatherholtz was given information about Medicaid Managed Care team care coordination services as a part of their [facility] Medicaid benefit. [name] verbally consented to engagement with the [facility] Managed Care team.   If you are experiencing a medical emergency, please call 911 or report to your local emergency department or urgent care.   If you have a non-emergency medical problem during routine business hours, please contact your provider's office and ask to speak with a nurse.   For questions related to your [facility], please call: [phone] or visit the homepage here: [email or web address]  If you would like to schedule transportation through your [facility], please call the following number at least 2 days in advance of your appointment: [phone]   Rides for urgent appointments can also be made after hours by calling Member Services.  Call the Behavioral Health Crisis Line at [phone], at any time, 24 hours a day, 7 days a week. If you are in danger or need immediate medical attention call 911.  If you would like help to quit smoking, call 1-800-QUIT-NOW (650-274-4060) OR Espaol: 1-855-Djelo-Ya (1-696-789-3810) o para ms informacin haga clic aqu or Text READY to 175-102 to register via text     24- Hour Availability:    [facility]  [address] [place], [place] [phone] Crisis [phone]   [facility] ([phone]  [place] Crisis [facility]  2032042169    [facility]  [phone] (after hours)   Therapeutic Alternative/Mobile Crisis   [phone]   [place] National Suicide Hotline  (207) 114-4410 Len Childs) [place] [address]   Call 775-505-7393 for mental health emergencies   [facility]  [phone]);  [place] and  [employer]  [phone]); [place], [place], [place], [place], [place], [place], [place]    [place] [facility] for [facility] Residents For 24/7 walk-up access to mental health services for [facility] children (4+), adolescents and adults, please visit the [place] [facility] located at [address] in [place], [place].  *[place] also provides comprehensive outpatient behavioral health services in a variety of locations around the [place].  Connect With [place] [address] [place], [place] [address] HelpLine: (817)443-6281 or 1-276-228-2914  Get Directions  Find Help 24/7 By Phone Call our 24-hour HelpLine at (518)051-3589 or 724-244-8149 for immediate assistance for mental health and substance abuse issues.  Walk-In Help [place]: [facility] (Ages 4 and Up) [place]: Emergency Dept., [facility] Additional Resources National Hopeline Network: 1-800-SUICIDE The National Suicide Prevention Lifeline: 1-800-273-TALK     10 LITTLE Things To Do When You're Feeling Too Down To Do Anything  Take a shower. Even if you plan to stay in all day long and not see a soul, take a shower. It takes the most effort to hop in to the shower but once you do, you'll feel immediate results. It will wake you up and you'll be feeling much fresher (and cleaner too).  Brush and floss your teeth. Give your teeth a good brushing with a floss finish. It's a small task but it feels so good and you can check 'taking care of your health' off the list of things to do.  Do something small on your list. Most of [place] have some small thing we would like to get done (load of laundry,  sew a button, email a friend). Doing one of these things will make you feel like you've accomplished something.  Drink water. Drinking water is easy right? It's also really beneficial for your health so  keep a glass beside you all day and take sips often. It gives you energy and prevents you from boredom eating.  Do some floor exercises. The last thing you want to do is exercise but it might be just the thing you need the most. Keep it simple and do exercises that involve sitting or laying on the floor. Even the smallest of exercises release chemicals in the brain that make you feel good. Yoga stretches or core exercises are going to make you feel good with minimal effort.  Make your bed. Making your bed takes a few minutes but it's productive and you'll feel relieved when it's done. An unmade bed is a huge visual reminder that you're having an unproductive day. Do it and consider it your housework for the day.  Put on some nice clothes. Take the sweatpants off even if you don't plan to go anywhere. Put on clothes that make you feel good. Take a look in the mirror so your brain recognizes the sweatpants have been replaced with clothes that make you look great. It's an instant confidence booster.  Wash the dishes. A pile of dirty dishes in the sink is a reflection of your mood. It's possible that if you wash up the dishes, your mood will follow suit. It's worth a try.  Cook a real meal. If you have the luxury to have a "do nothing" day, you have time to make a real meal for yourself. Make a meal that you love to eat. The process is good to get you out of the funk and the food will ensure you have more energy for tomorrow.  Write out your thoughts by hand. When you hand write, you stimulate your brain to focus on the moment that you're in so make yourself comfortable and write whatever comes into your mind. Put those thoughts out on paper so they stop spinning around in your head. Those thoughts might be the very thing holding you down.  Dickie La, BSW, MSW, [employer] Managed Medicaid LCSW [facility]  [facility] [email or web address] Phone: [phone]

## 2022-08-20 ENCOUNTER — Ambulatory Visit (HOSPITAL_COMMUNITY): Payer: Medicaid Other | Admitting: Psychiatry

## 2022-08-25 ENCOUNTER — Other Ambulatory Visit: Payer: Self-pay | Admitting: Physical Medicine and Rehabilitation

## 2022-08-26 NOTE — Telephone Encounter (Signed)
3. Will stop Keppra- for nerve pain- in 3 days, can stop

## 2022-08-27 ENCOUNTER — Other Ambulatory Visit: Payer: Medicaid Other | Admitting: Obstetrics and Gynecology

## 2022-08-27 NOTE — Patient Outreach (Signed)
  Medicaid Managed Care   Unsuccessful Attempt Note   08/27/2022 Name: [name] MRN: [id number] DOB: Nov 26, 1965  Referred by: [name], [name], [name] Reason for referral : High Risk Managed Medicaid (Unsuccessful telephone outreach)  Third unsuccessful telephone outreach was attempted today. The patient was referred to the case management team for assistance with care management and care coordination. The patient's primary care provider has been notified of our unsuccessful attempts to make or maintain contact with the patient. The care management team is pleased to engage with this patient at any time in the future should he/she be interested in assistance from the care management team.    Follow Up Plan: The  Patient has been provided with contact information for the Managed Medicaid care management team and has been advised to call with any health related questions or concerns. and The Managed Medicaid care management team is available to follow up with the patient after provider conversation with the patient regarding recommendation for care management engagement and subsequent re-referral to the care management team.    Kathi Der RN, BSN [place]  [facility] Care Management Coordinator - Managed [place] High Risk [phone]

## 2022-08-27 NOTE — Patient Instructions (Signed)
Hi Ms. Baynes, I am very sorry I missed you today, I hope you are okay- as a part of your Medicaid benefit, you are eligible for care management and care coordination services at no cost or copay. I was unable to reach you by phone today but would be happy to help you with your health related needs. Please feel free to call me at [phone].  Kathi Der RN, BSN [place]  [facility] - Managed Medicaid High Risk [phone]

## 2022-09-10 ENCOUNTER — Ambulatory Visit: Payer: Medicaid Other | Admitting: Licensed Clinical Social Worker

## 2022-09-20 ENCOUNTER — Encounter
Payer: Medicaid Other | Attending: Physical Medicine and Rehabilitation | Admitting: Physical Medicine and Rehabilitation

## 2022-09-20 ENCOUNTER — Encounter: Payer: Self-pay | Admitting: Physical Medicine and Rehabilitation

## 2022-09-20 VITALS — BP 123/81 | HR 59 | Ht 66.0 in | Wt 132.0 lb

## 2022-09-20 DIAGNOSIS — M7918 Myalgia, other site: Secondary | ICD-10-CM | POA: Diagnosis not present

## 2022-09-20 DIAGNOSIS — R1319 Other dysphagia: Secondary | ICD-10-CM | POA: Insufficient documentation

## 2022-09-20 DIAGNOSIS — G249 Dystonia, unspecified: Secondary | ICD-10-CM | POA: Diagnosis not present

## 2022-09-20 DIAGNOSIS — G20C Parkinsonism, unspecified: Secondary | ICD-10-CM | POA: Insufficient documentation

## 2022-09-20 DIAGNOSIS — M1811 Unilateral primary osteoarthritis of first carpometacarpal joint, right hand: Secondary | ICD-10-CM | POA: Diagnosis not present

## 2022-09-20 NOTE — Progress Notes (Signed)
Pt is a 57 yr old with generalized dystonia and Parkinsonism- also has panic attacks and anxiety and complex PTSD- from chilhood- - no HTN, DM- here for f/u- on dystonia.         Pain about the same Neck- can hardly turn neck-        Mother fell  June 1st- and broke hip and home with hospice- Not looking after self up til now.   Stopped Dantrolene  Trouble swallowing-  anything- got something soft stuck- couldn't get down or up-  Even when swallows saliva- it's hard- like a severe double swallow to get it down.  In upper throat  Feels a pain goes up through anterior neck muscles when swallows hard as well.  Has had "throat stretched before".  Last done "years ago"- 20-25 years ago.   Hands -cannot use hands- Having to take scissors to cut things.   Knot on R CMC joint.   Wants to "take pressure off"- neck and spine- asking about surgery.   Trigger point injections- not sure if helped.    Sinemet has tried in past- didn't   Hasn't responded to Duloxetine, Keppra, Dantrolene. - and baclofen   Exam: Decreased sensation to light touch on medial aspect of 4th digit.  No tinels'- at wrist or Elbow on R  Side Has large knot on R CMC joint Sensation normal in median distribution except medial aspect of 4th digit-       Plan: Ortho referral- Needs to see hand surgeon, if possible or someone for R [facility] joint swelling- has a "knot" on  CMC joint- that's making it difficult using her R hand at all- it's so painful.   2. GI referral- Pt having swallowing issues- hx of getting esophageal dilatation 20 years ago and sounds like needs to be done again- swallowing issues - I/e food and fluids- gets stuck in mid esophagus based on where she describes-  and causes pain  3. We discussed pain is likely from muscle tightness form dystonia.   4. I don't treat Parkinsonism- if wants Sinemet- would need to send Neurology- Saw Dr Frazier Richards-  but didn't give any meds- can call there and  ask to see someone other than Dr Arlana Pouch.  Doesn't want referral to [facility] right now.   5. Trigger point injections- Patient here for trigger point injections for  Consent done and on chart.  Cleaned areas with alcohol and injected using a 27 gauge 1.5 inch needle  Injected 6cc- none wasted Using 1% Lidocaine with no EPI  Upper traps B/L  Levators- B/L  Posterior scalenes Middle scalenes=- B/L  Splenius Capitus- b/L  Pectoralis Major Rhomboids- B/LL  Infraspinatus Teres Major/minor Thoracic paraspinals Lumbar paraspinals Other injections-    Patient's level of pain prior was  Current level of pain after injections is- pain better- can roll shoulders and turn head better. MUCH better- had great results.   There was no bleeding or complications.  Patient was advised to drink a lot of water on day after injections to flush system Will have increased soreness for 12-48 hours after injections.  Can use Lidocaine patches the day AFTER injections Can use theracane on day of injections in places didn't inject Can use heating pad 4-6 hours AFTER injections   6. Discussed pt's medical issues- and her concerns- - at length   7. F/U 6 weeks- for Trigger points- and f/u on dystonia   I spent a total of 42   minutes on  total care today- >50% coordination of care- due to 10 minutes on injections- and rest discussing medical issues as detailed above and dealing pt with pt grief.

## 2022-09-20 NOTE — Patient Instructions (Signed)
Plan: Ortho referral- Needs to see hand surgeon, if possible or someone for R [facility] joint swelling- has a "knot" on  CMC joint- that's making it difficult using her R hand at all- it's so painful.   2. GI referral- Pt having swallowing issues- hx of getting esophageal dilatation 20 years ago and sounds like needs to be done again- swallowing issues - I/e food and fluids- gets stuck in mid esophagus based on where she describes-  and causes pain  3. We discussed pain is likely from muscle tightness form dystonia.   4. I don't treat Parkinsonism- if wants Sinemet- would need to send Neurology- Saw Dr Frazier Richards-  but didn't give any meds- can call there and ask to see someone other than Dr Arlana Pouch.  Doesn't want referral to [facility] right now.   5. Trigger point injections- Patient here for trigger point injections for  Consent done and on chart.  Cleaned areas with alcohol and injected using a 27 gauge 1.5 inch needle  Injected 6cc- none wasted Using 1% Lidocaine with no EPI  Upper traps B/L  Levators- B/L  Posterior scalenes Middle scalenes=- B/L  Splenius Capitus- b/L  Pectoralis Major Rhomboids- B/LL  Infraspinatus Teres Major/minor Thoracic paraspinals Lumbar paraspinals Other injections-    Patient's level of pain prior was  Current level of pain after injections is- pain better- can roll shoulders and turn head better. MUCH better- had great results.   There was no bleeding or complications.  Patient was advised to drink a lot of water on day after injections to flush system Will have increased soreness for 12-48 hours after injections.  Can use Lidocaine patches the day AFTER injections Can use theracane on day of injections in places didn't inject Can use heating pad 4-6 hours AFTER injections   6. Discussed pt's medical issues- and her concerns- - at length   7. F/U 6 weeks- for Trigger points- and f/u on dystonia

## 2022-09-30 ENCOUNTER — Ambulatory Visit (INDEPENDENT_AMBULATORY_CARE_PROVIDER_SITE_OTHER): Payer: Medicaid Other

## 2022-09-30 ENCOUNTER — Ambulatory Visit (INDEPENDENT_AMBULATORY_CARE_PROVIDER_SITE_OTHER): Payer: Medicaid Other | Admitting: Family Medicine

## 2022-09-30 ENCOUNTER — Encounter: Payer: Self-pay | Admitting: Family Medicine

## 2022-09-30 VITALS — BP 118/80 | HR 67 | Temp 98.6°F | Resp 12 | Ht 66.0 in | Wt 131.5 lb

## 2022-09-30 DIAGNOSIS — R051 Acute cough: Secondary | ICD-10-CM | POA: Diagnosis not present

## 2022-09-30 DIAGNOSIS — R059 Cough, unspecified: Secondary | ICD-10-CM | POA: Diagnosis not present

## 2022-09-30 DIAGNOSIS — R0602 Shortness of breath: Secondary | ICD-10-CM | POA: Diagnosis not present

## 2022-09-30 DIAGNOSIS — J069 Acute upper respiratory infection, unspecified: Secondary | ICD-10-CM

## 2022-09-30 DIAGNOSIS — R509 Fever, unspecified: Secondary | ICD-10-CM | POA: Diagnosis not present

## 2022-09-30 MED ORDER — BENZONATATE 100 MG PO CAPS
100.0000 mg | ORAL_CAPSULE | Freq: Two times a day (BID) | ORAL | 0 refills | Status: AC | PRN
Start: 2022-09-30 — End: 2022-10-10

## 2022-09-30 MED ORDER — FLUTICASONE PROPIONATE 50 MCG/ACT NA SUSP
1.0000 | Freq: Two times a day (BID) | NASAL | 0 refills | Status: DC
Start: 2022-09-30 — End: 2023-12-20

## 2022-09-30 NOTE — Patient Instructions (Addendum)
A few things to remember from today's visit:  URI, acute  SOB (shortness of breath) - Plan: DG Chest 2 View, CBC with Differential/Platelet  Most likely viral. Nasal congestion can you with a prescription of shortness of breath.  Today we are going to do blood work and chest x-ray. Monitor for new symptoms. Contact precautions. Follow-up with your PCP in 1 week if fever is persisting or symptoms are not getting any better. Try plain Mucinex and continue adequate hydration.  Nasal saline irrigation as needed throughout the day. Flonase nasal spray at bedtime for 10 to 14 days then as needed.  Do not use My Chart to request refills or for acute issues that need immediate attention. If you send a my chart message, it may take a few days to be addressed, specially if I am not in the office.  Please be sure medication list is accurate. If a new problem present, please set up appointment sooner than planned today.

## 2022-09-30 NOTE — Progress Notes (Signed)
ACUTE VISIT Chief Complaint  Patient presents with   chest congestion    Nasal congestion, took 2 covid tests at home both negative.    HPI: [name] is a 58 y.o. female with PMHx significant for dystonia, primary parkinsonism, fibromyalgia, and anxiety here today complaining of 2-3 days of respiratory symptoms. Body aches, fatigue, fever, rhinorrhea, nasal congestion, ear fullness sensation,and productive cough with greenish sputum. No associated CP or wheezing. He reports some shortness of breath at rest.  Cough This is a new problem. The current episode started in the past 7 days. The problem has been unchanged. Associated symptoms include chills, ear congestion, a fever, headaches, myalgias, nasal congestion, postnasal drip, rhinorrhea, a sore throat and shortness of breath. Pertinent negatives include no chest pain, ear pain, heartburn, hemoptysis or rash. The symptoms are aggravated by lying down. She has tried nothing for the symptoms.  Fever of 101F on Saturday and Sunday.   She denies any recent sick contacts or travel.  She reports receiving trigger points neck injections that Friday morning, started with symptoms same day at night with pruritic throat, no associated oral edema or stridor. She reports nausea for the past couple days. Denies changes in bowel habits, vomiting, or urinary symptoms.  She is a non-smoker.   She has been taking Tylenol and Zicam without relief.   She is concerned about potentially passing this illness to her mother who is in hospice care at home.  Review of Systems  Constitutional:  Positive for chills and fever.  HENT:  Positive for postnasal drip, rhinorrhea, sinus pressure, sneezing and sore throat. Negative for ear discharge, ear pain, facial swelling, hearing loss, mouth sores and trouble swallowing.   Respiratory:  Positive for cough and shortness of breath. Negative for hemoptysis.   Cardiovascular:  Negative for chest pain.   Gastrointestinal:  Negative for heartburn.  Genitourinary:  Negative for decreased urine volume, dysuria and hematuria.  Musculoskeletal:  Positive for myalgias.  Skin:  Negative for rash.  Neurological:  Positive for headaches. Negative for syncope and weakness.  See other pertinent positives and negatives in HPI.  Current Outpatient Medications on File Prior to Visit  Medication Sig Dispense Refill   baclofen (LIORESAL) 10 MG tablet TAKE 1/2 (ONE-HALF) TABLET BY MOUTH 4 TIMES DAILY FOR 7 DAYS THEN TAKE 1 TABLET 4 TIMES DAILY IF NEED BE, AFTER ANOTHER ONE WEEK, CAN INCREASE TO 1 & 1/2 (ONE & ONE-HALF) TABLETS 4 TIMES DAILY FOR GENERALIZED DYSTONIA     clonazePAM (KLONOPIN) 1 MG tablet Take 1 tablet (1 mg total) by mouth 2 (two) times daily. 60 tablet 2   dantrolene (DANTRIUM) 50 MG capsule TAKE 1 CAPSULE BY MOUTH AT BEDTIME FOR 7 DAYS, THEN 1 CAPSULE TWICE DAILY FOR 7 DAYS, THEN 1 CAPSULE THREE TIMES DAILY FOR 7 DAYS, THEN 2 CAPSULES TWICE DAILY FOR DYSTONIA. THERE ARE NO OTHER MEDS THAT TREAT DYSTONIA THAN DANTROLENE AND KEPPRA.     levETIRAcetam (KEPPRA) 250 MG tablet Take by mouth.     mirtazapine (REMERON) 7.5 MG tablet Take 1 tablet (7.5 mg total) by mouth at bedtime. 30 tablet 0   ondansetron (ZOFRAN) 4 MG tablet Take 1 tablet (4 mg total) by mouth every 8 (eight) hours as needed for nausea or vomiting. 90 tablet 0   promethazine (PHENERGAN) 12.5 MG tablet Take 1 tablet (12.5 mg total) by mouth every 6 (six) hours as needed for nausea or vomiting. 30 tablet 1   sertraline (ZOLOFT) 50  MG tablet Take 1 tablet (50 mg total) by mouth at bedtime. 90 tablet 1   tiZANidine (ZANAFLEX) 4 MG tablet Indications: muscle spasm     No current facility-administered medications on file prior to visit.   Past Medical History:  Diagnosis Date   Anxiety    Depression    Movement disorder    MS (multiple sclerosis) (HCC)    Other specified cardiac arrhythmias 1990s   Panic attacks    Polyneuritis     PTSD (post-traumatic stress disorder)    Tremors of nervous system    Allergies  Allergen Reactions   Penicillins Anaphylaxis   Contrast Media [Iodinated Contrast Media] Other (See Comments)    unknown   Sulfa Antibiotics Rash   Social History   Socioeconomic History   Marital status: Divorced    Spouse name: Not on file   Number of children: 1   Years of education: Not on file   Highest education level: 12th grade  Occupational History   Not on file  Tobacco Use   Smoking status: Never   Smokeless tobacco: Never  Substance and Sexual Activity   Alcohol use: No    Comment: 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 occ marijuana (1 /mo)   Sexual activity: Not Currently  Other Topics Concern   Not on file  Social History Narrative   Patient is divorced, lives with her mother. She only occasionally drinks caffeine. Exercise is limited.      Right handed   Social Determinants of Health   Financial Resource Strain: Not on file  Food Insecurity: No Food Insecurity (06/24/2022)   Hunger Vital Sign    Worried About Running Out of Food in the Last Year: Never true    Ran Out of Food in the Last Year: Never true  Transportation Needs: No Transportation Needs (06/24/2022)   PRAPARE - Administrator, Civil Service (Medical): No    Lack of Transportation (Non-Medical): No  Physical Activity: Not on file  Stress: Stress Concern Present (08/19/2022)   [employer] of Occupational Health - Occupational Stress Questionnaire    Feeling of Stress : Very much  Social Connections: Unknown (06/19/2021)   Received from [facility]   Social Network    Social Network: Not on file   Vitals:   09/30/22 1446  BP: 118/80  Pulse: 67  Resp: 12  Temp: 98.6 F (37 C)  SpO2: 98%   Body mass index is 21.22 kg/m.  Physical Exam Vitals and nursing note reviewed.   Constitutional:      General: She is not in acute distress.    Appearance: She is well-developed. She is not ill-appearing.  HENT:     Head: Normocephalic and atraumatic.     Right Ear: Tympanic membrane, ear canal and external ear normal.     Left Ear: Tympanic membrane, ear canal and external ear normal.     Nose: Septal deviation and rhinorrhea present.     Mouth/Throat:     Mouth: Mucous membranes are moist.     Pharynx: Oropharynx is clear.  Eyes:     Conjunctiva/sclera: Conjunctivae normal.  Cardiovascular:     Rate and Rhythm: Normal rate and regular rhythm.     Heart sounds: No murmur heard. Pulmonary:     Effort: Pulmonary effort is normal. No respiratory distress.  Breath sounds: Normal breath sounds. No stridor.  Lymphadenopathy:     Head:     Right side of head: No submandibular adenopathy.     Left side of head: No submandibular adenopathy.     Cervical: No cervical adenopathy.  Skin:    General: Skin is warm.     Findings: No erythema or rash.  Neurological:     Mental Status: She is alert and oriented to person, place, and time.     Motor: Tremor present.  Psychiatric:        Mood and Affect: Affect normal. Mood is anxious.   ASSESSMENT AND PLAN:  Ms. Elser was seen today for acute respiratory symptoms.  URI, acute We discussed possible etiologies, most likely viral illness in which case symptomatic treatment is recommended. Reporting negative COVID-19 test at home x 2.  Because she is reporting shortness of breath, chest x-ray and CBC ordered today. Tylenol 500 mg 3-4 times per day, plenty of p.o. fluids, and throat lozenges for symptoms management. Nasal saline irrigations and Flonase to help with nasal congestion and rhinorrhea. Continue monitoring temperature, if fever is persistent for 5 more days, recommend arranging f/u appt with PCP. Clearly instructed about warning signs.  -     Fluticasone Propionate; Place 1 spray into both nostrils 2  (two) times daily.  Dispense: 16 g; Refill: 0  SOB (shortness of breath) Today she is not in any respiratory distress. Lung auscultation is normal. She was clearly instructed about warning signs. Further recommendation will be given according to lab and imaging results.  -     DG Chest 2 View; Future -     CBC with Differential/Platelet; Future  Acute cough Explained that cough and congestion can last a few more days and even weeks after acute symptoms have resolved. Monitor for new symptoms. Symptomatic treatment with benzonatate recommended. OTC plain Mucinex may also help.  -     Benzonatate; Take 1 capsule (100 mg total) by mouth 2 (two) times daily as needed for up to 10 days.  Dispense: 20 capsule; Refill: 0  CXR: I do not appreciate acute changes when compared with CXR done in 02/2015.  Return in about 1 week (around 10/07/2022) for SOB with PCP.  Laurance Heide G. [place], MD  [facility]. [place] office.

## 2022-10-01 ENCOUNTER — Telehealth: Payer: Self-pay | Admitting: Physician Assistant

## 2022-10-01 LAB — CBC WITH DIFFERENTIAL/PLATELET
Basophils Absolute: 0.1 10*3/uL (ref 0.0–0.1)
Basophils Relative: 0.9 % (ref 0.0–3.0)
Eosinophils Absolute: 0.1 10*3/uL (ref 0.0–0.7)
Eosinophils Relative: 1 % (ref 0.0–5.0)
HCT: 41.6 % (ref 36.0–46.0)
Hemoglobin: 13.5 g/dL (ref 12.0–15.0)
Lymphocytes Relative: 19.2 % (ref 12.0–46.0)
Lymphs Abs: 1.7 10*3/uL (ref 0.7–4.0)
MCHC: 32.5 g/dL (ref 30.0–36.0)
MCV: 100 fl (ref 78.0–100.0)
Monocytes Absolute: 0.8 10*3/uL (ref 0.1–1.0)
Monocytes Relative: 8.6 % (ref 3.0–12.0)
Neutro Abs: 6.2 10*3/uL (ref 1.4–7.7)
Neutrophils Relative %: 70.3 % (ref 43.0–77.0)
Platelets: 205 10*3/uL (ref 150.0–400.0)
RBC: 4.16 Mil/uL (ref 3.87–5.11)
RDW: 13 % (ref 11.5–15.5)
WBC: 8.8 10*3/uL (ref 4.0–10.5)

## 2022-10-01 NOTE — Telephone Encounter (Signed)
Patient called over weekend stating she believed she had covid. Final outcome: See HCP within 4 hours. Patient was seen by [name] on 8/26.    Patient Name First: Ashley Last: [name] Gender: Female DOB: 21-Oct-1965 Age: 57 Y 10 M 23 D Return Phone Number: [phone] (Primary) Address: City/ State/ Zip: [place] [address] Client [facility] - Human resources officer [facility] [employer] Type Call Who Is Calling Patient / Member / Family / Caregiver Call Type Triage / Clinical Relationship To Patient Self Return Phone Number [phone] (Primary) Chief Complaint Muscle pain Reason for Call Symptomatic / Request for Health Information Initial Comment Caller states that she thinks she may have Covid. She has not tested yet. She has the chills and a cough and body aches and congestion. Her mother is at home in Hospice, and she would like for some medication to be called in for her by her doctor. Her doctors name is [name]. Translation No Nurse Assessment Nurse: Ashley Purl, RN, [name] Date/Time [name] Time): 09/29/2022 4:38:28 PM Confirm and document reason for call. If symptomatic, describe symptoms. ---Pt is calling as she believes she has covid. Last week a caregiver came and informed pt , her dtr tested positive for COVID. Caregiver did not have any symptoms. Pt has had a cough since Friday, coughing up green sputum, nasal congestion, and had a temp of 101 oral . She states she with her mom at [employer] . Her mom is on hospice and she doesnt want to leave her. Pt has not tested for covid at home. No dyspnea. No Doe. Pt endorses some intermittent chest tightness 2/10 - centrally located. Does the patient have any new or worsening symptoms? ---Yes Will a triage be completed? ---Yes Related visit to physician within the last 2 weeks? ---No Does the PT have any chronic conditions? (i.e. diabetes,  asthma, this includes High risk factors for pregnancy, etc.) ---Yes List chronic conditions. ---Parkinson's, MS , dystonia Is this a behavioral health or substance abuse call? ---No  Guidelines Guideline Title Affirmed Question Affirmed Notes Nurse Date/Time (Eastern Time) Cough - Acute Productive [1] Fever > 100.0 F (37.8 C) AND [2] diabetes mellitus or weak immune system (e.g., HIV positive, cancer chemo, splenectomy, organ transplant, chronic steroids) Ashley Purl, RN, [facility] 09/29/2022 4:39:54 PM Disp. Time [name] Time) Disposition Final User 09/29/2022 4:51:33 PM See HCP within 4 Hours (or PCP triage) Yes Ashley Purl, RN, [name] Final Disposition 09/29/2022 4:51:33 PM See HCP within 4 Hours (or PCP triage) Yes Ashley Purl, RN, [name] Disagree/Comply Comply Caller Understands Yes PreDisposition Home Care Care Advice Given Per Guideline SEE HCP (OR PCP TRIAGE) WITHIN 4 HOURS: * IF OFFICE WILL BE CLOSED AND NO PCP (PRIMARY CARE PROVIDER) SECOND-LEVEL TRIAGE: You need to be seen within the next 3 or 4 hours. A nearby Urgent Care Center [facility]) is often a good source of care. Another choice is to go to the ED. Go sooner if you become worse. FEVER MEDICINE - ACETAMINOPHEN: * The goal of fever therapy is to bring the fever down to a comfortable level. Remember that fever medicine usually lowers fever 2-3 F (1-1.5 C). CALL BACK IF: * You become worse CARE ADVICE given per Cough - Acute Productive (Adult) guideline. Comments User: Ashley Leep, RN Date/Time [name] Time): 09/29/2022 4:50:21 PM ...scientists also identified biomarkers that suggest the immune system of Parkinson's disease patients ages faster. [email or web address].[email or web address] User: Ashley Leep, RN Date/Time [name] Time): 09/29/2022  4:51:27 PM Per directives: Do not call for Meds after-hours. Pt was calling for a script. Pt advised of 3-4 H outcome . She verbalized understanding.  Suggested to pt she had a virtual visit with a provider today she she can remain at home with her mom

## 2022-10-02 NOTE — Telephone Encounter (Signed)
Noted and agreed, thank you. 

## 2022-10-08 ENCOUNTER — Ambulatory Visit: Payer: Medicaid Other | Admitting: Orthopedic Surgery

## 2022-10-21 ENCOUNTER — Encounter
Payer: Medicaid Other | Attending: Physical Medicine and Rehabilitation | Admitting: Physical Medicine and Rehabilitation

## 2022-10-21 DIAGNOSIS — R1319 Other dysphagia: Secondary | ICD-10-CM | POA: Insufficient documentation

## 2022-10-21 DIAGNOSIS — G20C Parkinsonism, unspecified: Secondary | ICD-10-CM | POA: Insufficient documentation

## 2022-10-21 DIAGNOSIS — G249 Dystonia, unspecified: Secondary | ICD-10-CM | POA: Insufficient documentation

## 2022-10-21 DIAGNOSIS — M7918 Myalgia, other site: Secondary | ICD-10-CM | POA: Insufficient documentation

## 2022-10-21 DIAGNOSIS — M1811 Unilateral primary osteoarthritis of first carpometacarpal joint, right hand: Secondary | ICD-10-CM | POA: Insufficient documentation

## 2022-11-25 ENCOUNTER — Encounter: Payer: Medicaid Other | Admitting: Physical Medicine and Rehabilitation

## 2023-01-06 ENCOUNTER — Encounter
Payer: Medicaid Other | Attending: Physical Medicine and Rehabilitation | Admitting: Physical Medicine and Rehabilitation

## 2023-01-06 DIAGNOSIS — G20C Parkinsonism, unspecified: Secondary | ICD-10-CM | POA: Insufficient documentation

## 2023-01-06 DIAGNOSIS — R1319 Other dysphagia: Secondary | ICD-10-CM | POA: Insufficient documentation

## 2023-01-06 DIAGNOSIS — G249 Dystonia, unspecified: Secondary | ICD-10-CM | POA: Insufficient documentation

## 2023-01-06 DIAGNOSIS — M1811 Unilateral primary osteoarthritis of first carpometacarpal joint, right hand: Secondary | ICD-10-CM | POA: Insufficient documentation

## 2023-01-06 DIAGNOSIS — M7918 Myalgia, other site: Secondary | ICD-10-CM | POA: Insufficient documentation

## 2023-02-03 ENCOUNTER — Telehealth (HOSPITAL_COMMUNITY): Payer: Self-pay

## 2023-02-03 NOTE — Telephone Encounter (Signed)
02/06/23 appt confirmed

## 2023-02-06 ENCOUNTER — Encounter (HOSPITAL_COMMUNITY): Payer: Self-pay | Admitting: Psychiatry

## 2023-02-06 ENCOUNTER — Ambulatory Visit (INDEPENDENT_AMBULATORY_CARE_PROVIDER_SITE_OTHER): Payer: Medicaid Other | Admitting: Psychiatry

## 2023-02-06 DIAGNOSIS — F411 Generalized anxiety disorder: Secondary | ICD-10-CM | POA: Diagnosis not present

## 2023-02-06 DIAGNOSIS — F331 Major depressive disorder, recurrent, moderate: Secondary | ICD-10-CM

## 2023-02-06 DIAGNOSIS — F4310 Post-traumatic stress disorder, unspecified: Secondary | ICD-10-CM
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
## 2023-02-16 NOTE — Progress Notes (Signed)
 Cardiology Office Note:  .   Date:  02/18/2023  ID:  [name], DOB 1965-03-17, MRN [id number] PCP: [name], Mardy HERO, [name]  [facility] Providers Cardiologist:  Shelda Bruckner, MD {  History of Present Illness: .   [name] is a 58 y.o. female with PMH anxiety, panic disorder, family history of heart disease, Parkinson's, cervical dystonia. She is here for evaluation of chest pain and dyspnea on exertion.  Today: She has been having sharp pains in her chest for a long time, several years. Happens about twice a week, brief, like electricity. Arms and shoulders tingle, and then she gets indigestion/nausea. Can be at rest or with activity. Longest episode several minutes, but usually very brief.  Was in ICU in [place] about 25 years ago, seen by Dr. Debera. Told her heart had irregular beats, was on metoprolol for a time and then stopped.  Has panic and anxiety attacks. She has difficulty determining what is her heart and what is her anxiety.  Push mows the yard, but but had worsening shortness of breath, nausea, sweats with this over the summer that she has not had before.  Care gives for her mother, stays active with household activities but no intentional exercise. Doesn't eat as well as she should.  Allergic to IV contrast for CT.  ROS positive for constant diffuse burning pain.  ROS: Denies PND, orthopnea, LE edema or unexpected weight gain. No syncope or palpitations. ROS otherwise negative except as noted.   Studies Reviewed: SABRA    EKG:  EKG Interpretation Date/Time:  Tuesday February 18 2023 10:00:23 EST Ventricular Rate:  62 PR Interval:  122 QRS Duration:  74 QT Interval:  428 QTC Calculation: 434 R Axis:   -23  Text Interpretation: Normal sinus rhythm Normal ECG When compared with ECG of 09-Sep-2017 15:33, No significant change was found Confirmed by Bruckner Shelda [phone]) on 02/18/2023 10:28:31 AM    Physical Exam:   VS:  BP (!)  140/86   Pulse 64   Ht 5' 6 (1.676 m)   Wt 132 lb 1.6 oz (59.9 kg)   SpO2 98%   BMI 21.32 kg/m    Wt Readings from Last 3 Encounters:  02/18/23 132 lb 1.6 oz (59.9 kg)  09/30/22 131 lb 8 oz (59.6 kg)  09/20/22 132 lb (59.9 kg)    GEN: Well nourished, well developed in no acute distress HEENT: Normal, moist mucous membranes NECK: No JVD CARDIAC: regular rhythm, normal S1 and S2, no rubs or gallops. No murmur. VASCULAR: Radial and DP pulses 2+ bilaterally. No carotid bruits RESPIRATORY:  Clear to auscultation without rales, wheezing or rhonchi  ABDOMEN: Soft, non-tender, non-distended MUSCULOSKELETAL:  Ambulates independently SKIN: Warm and dry, no edema NEUROLOGIC:  Alert and oriented x 3. No focal neuro deficits noted. PSYCHIATRIC:  Normal affect    ASSESSMENT AND PLAN: .    Chest pain Dyspnea on exertion Family history of heart disease -very concerned about blockages given her family history and caregiving for her mother -allergic to IV contrast dye, so will not pursue CT coronary -after shared decision making, will pursue stress echocardiogram  Informed Consent   Shared Decision Making/Informed Consent The risks [chest pain, shortness of breath, cardiac arrhythmias, dizziness, blood pressure fluctuations, myocardial infarction, stroke/transient ischemic attack, and life-threatening complications (estimated to be 1 in 10,000)], benefits (risk stratification, diagnosing coronary artery disease, treatment guidance) and alternatives of a stress or dobutamine stress echocardiogram were discussed in detail with Ms. [name]  and she agrees to proceed.     -overwhelmed with caregiving. She has no support for care of her mother. Will see if our social worker has any connections for respite care or help for her mother. 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 Limit salt. -recommend moderate walking, 3-5 times/week for 30-50 minutes each session. Aim for at least 150 minutes.week. Goal should be pace of 3 miles/hours, or walking 1.5 miles in 30 minutes -recommend avoidance of tobacco products. Avoid excess alcohol. -ASCVD risk score: The 10-year ASCVD risk score (Arnett DK, et al., 2019) is: 1.7%   Values used to calculate the score:     Age: 64 years     Sex: Female     Is Non-Hispanic African American: No     Diabetic: No     Tobacco smoker: No     Systolic Blood Pressure: 140 mmHg     Is BP treated: No     HDL Cholesterol: 65 mg/dL     Total Cholesterol: 148 mg/dL    Dispo: 1 year or sooner as needed, if testing unremarkable  Signed, Shelda Bruckner, MD   Shelda Bruckner, MD, PhD, [facility]  [facility]  [place]  [facility] at [facility] [address], [address] [place], [place] [address] [phone]

## 2023-02-17 ENCOUNTER — Encounter: Payer: Medicaid Other | Admitting: Physical Medicine and Rehabilitation

## 2023-02-18 ENCOUNTER — Ambulatory Visit (INDEPENDENT_AMBULATORY_CARE_PROVIDER_SITE_OTHER): Payer: Medicaid Other | Admitting: Cardiology

## 2023-02-18 ENCOUNTER — Encounter (HOSPITAL_BASED_OUTPATIENT_CLINIC_OR_DEPARTMENT_OTHER): Payer: Self-pay | Admitting: Cardiology

## 2023-02-18 VITALS — BP 136/78 | HR 64 | Ht 66.0 in | Wt 132.1 lb

## 2023-02-18 DIAGNOSIS — Z8249 Family history of ischemic heart disease and other diseases of the circulatory system: Secondary | ICD-10-CM | POA: Diagnosis not present

## 2023-02-18 DIAGNOSIS — R0609 Other forms of dyspnea: Secondary | ICD-10-CM

## 2023-02-18 DIAGNOSIS — Z7189 Other specified counseling: Secondary | ICD-10-CM

## 2023-02-18 DIAGNOSIS — R079 Chest pain, unspecified: Secondary | ICD-10-CM | POA: Diagnosis not present

## 2023-02-18 NOTE — Patient Instructions (Addendum)
 Medication Instructions:  Your physician recommends that you continue on your current medications as directed. Please refer to the Current Medication list given to you today.  *If you need a refill on your cardiac medications before your next appointment, please call your pharmacy*   Testing/Procedures: Your physician has requested that you have a stress echocardiogram. For further information please visit [email or web address] Please follow instruction sheet as given.  Please note: We ask at that you not bring children with you during ultrasound (echo/ vascular) testing. Due to room size and safety concerns, children are not allowed in the ultrasound rooms during exams. Our front office staff cannot provide observation of children in our lobby area while testing is being conducted. An adult accompanying a patient to their appointment will only be allowed in the ultrasound room at the discretion of the ultrasound technician under special circumstances. We apologize for any inconvenience.  Follow-Up: At [facility], you and your health needs are our priority.  As part of our continuing mission to provide you with exceptional heart care, we have created designated Provider Care Teams.  These Care Teams include your primary Cardiologist (physician) and Advanced Practice Providers (APPs -  Physician Assistants and Nurse Practitioners) who all work together to provide you with the care you need, when you need it.  We recommend signing up for the patient portal called MyChart.  Sign up information is provided on this After Visit Summary.  MyChart is used to connect with patients for Virtual Visits (Telemedicine).  Patients are able to view lab/test results, encounter notes, upcoming appointments, etc.  Non-urgent messages can be sent to your provider as well.   To learn more about what you can do with MyChart, go to [email or web address].    Your next appointment:   12 month(s)  Provider:    Shelda Bruckner, MD    Other Instructions   You have been referred to our care navigation team. One of our social workers will reach out to you in a couple weeks.

## 2023-02-19 ENCOUNTER — Telehealth: Payer: Self-pay | Admitting: Licensed Clinical Social Worker

## 2023-02-19 NOTE — Telephone Encounter (Signed)
 H&V Care Navigation CSW Progress Note  Clinical Social Worker contacted patient by phone to f/u on referral for several SDOH needs. No answer today at [phone]. Will re-attempt again as able, left vociemail requesting return call.   Patient is participating in a Managed Medicaid Plan:  Yes- [employer]  SDOH Screenings   Food Insecurity: No Food Insecurity (06/24/2022)  Housing: Low Risk  (06/03/2022)  Transportation Needs: No Transportation Needs (06/24/2022)  Utilities: Not At Risk (06/24/2022)  Depression (PHQ2-9): High Risk (02/06/2023)  Social Connections: Unknown (06/19/2021)   Received from [facility]  Stress: Stress Concern Present (08/19/2022)  Tobacco Use: Low Risk  (02/18/2023)    Nathen Balder, MSW, LCSW Clinical Social Worker II [facility] Heart/Vascular Care Navigation  [phone]- work cell phone (preferred) [phone]- desk phone

## 2023-02-24 ENCOUNTER — Telehealth: Payer: Self-pay | Admitting: Licensed Clinical Social Worker

## 2023-02-24 NOTE — Telephone Encounter (Signed)
H&V Care Navigation CSW Progress Note  Clinical Social Worker contacted patient by phone to f/u on referral for several SDOH needs. No answer today again at [phone]. Will re-attempt again as able, left 2nd vociemail requesting return call.    Patient is participating in a Managed Medicaid Plan:  Yes- [employer]  SDOH Screenings   Food Insecurity: No Food Insecurity (06/24/2022)  Housing: Low Risk  (06/03/2022)  Transportation Needs: No Transportation Needs (06/24/2022)  Utilities: Not At Risk (06/24/2022)  Depression (PHQ2-9): High Risk (02/06/2023)  Social Connections: Unknown (06/19/2021)   [facility] from [facility], [facility]  Stress: Stress Concern Present (08/19/2022)  Tobacco Use: Low Risk  (02/18/2023)    [name], MSW, LCSW Clinical Social Worker II [facility] Health Heart/Vascular Care Navigation  [phone]- work cell phone (preferred) [phone]- desk phone

## 2023-02-26 ENCOUNTER — Telehealth: Payer: Self-pay | Admitting: Licensed Clinical Social Worker

## 2023-02-26 NOTE — Progress Notes (Unsigned)
Heart and Vascular Care Navigation  02/26/2023  [name] 02/05/1965 [id number]  Reason for Referral: caregiver burden, financial stress Patient is participating in a Managed Medicaid Plan:Yes  Engaged with patient by telephone for initial visit for Heart and Vascular Care Coordination.                                                                                                   Assessment:                                     LCSW spoke with pt via telephone today at [phone]. Introduced self, role, reason for call. Pt confirmed home address, PCP, and insurance is Managed Medicaid. Resides with her mother for whom she is the primary caregiver. Sounds like her mother is intermittently enrolled with [facility] hospice/palliative care services. She has multiple chronic health/mental health needs which she manages with several provider's assistance. She has access to transportation. Her son is her main support. She has applied for social security disability but received a letter stating she was approved but over asset limits due to a previous 40K rolled over. She has spoken with the folks that manage that income and they believe it shouldn't be causing these issues. Pt had filed for disability herself so doesn't have anyone to reference for questions. Pt receives SNAP but sometimes feels they dont have enough food. Her mother receives social security income that is too high for Medicaid or additional assistance at this time. They use that to pay house costs/utilities/other needs at this time.   Pt shares the stress of taking care of her mother wears on her. She has a sister but they do not provide (per her report) much day to day assistance and pt son is also frustrated by how pt mother treats her. Pt has been connected with mental health resources from [employer] and had her first appt with [facility] for assessment. Her next scheduled f/u is in  March. Pt mother can be demanding, she toilets independently but requires assistance with all other housecare and IADL/ADLs. Pt feels these exacerbate her mental and physical health challenges. She is concerned about what to do if her mother declines more given that the home she resides in is her mothers and she doesn't have income at this time. She also feels it is increasingly unsafe to leave her mother at home alone but she needs that time to get to her appts and to daily errands.   LCSW throughout conversation provided verbal support that caregiving is challenging even for those who aren't managing their own health. I assured pt that it is not okay that her mother says things that are not kind to her. I also shared that the best thing that she can do for her mother is to make sure that she (pt) takes care of her health needs. I encouraged her to have a conversation with her sister, son and the palliative team to see if she can  enlist them to sit with her mother during these times when she needs to get things done. I shared palliative care teams often have social workers that can also help facilitate these supportive interventions. Palliative may also be able to provide some respite services. I can also send some additional caregiving/respite services in [facility] for pt to use to try and gain some support.   I also encouraged pt to attend some care planning workshops or webinars, or speak with an eldercare lawyer regarding care planning. Since pt and pt mother are so tied to each other for care/housing etc what happens to one of them will likely effect the other. This also may be a conversation that she would like to include her sister in since her sister may need to provide more care/care planning if an emergency was to happen.   Finally, I discussed that we could try and refer pt to [employer] to sort out social security challenges- they often assist pts with disability claims and may have more  insight. She would need to complete release of information if interested in their help. Since I am not aware of what pt assets are and what the letter specifically said/what benefits she was being considered for then I cannot advise her on next steps. She also was encouraged to bring her information regarding her accounts with money and her determination letter to Social Security to see what her options are.   I will mail pt some community resources and [facility] and f/u to ensure these have been received.   HRT/VAS Care Coordination     Patients Home Cardiology Office --  [facility]   Outpatient Care Team Social Worker   Social Worker Name: [name], [place], [phone]   Living arrangements for the past 2 months Single Family Home   Lives with: Parents   Patient Current Insurance Coverage Medicaid   Patient Has Concern With Paying Medical Bills No   Does Patient Have Prescription Coverage? Yes       Social History:                                                                             SDOH Screenings   Food Insecurity: Food Insecurity Present (02/26/2023)  Housing: Low Risk  (02/26/2023)  Transportation Needs: No Transportation Needs (02/26/2023)  Utilities: Not At Risk (02/26/2023)  Depression (PHQ2-9): High Risk (02/06/2023)  Financial Resource Strain: Medium Risk (02/26/2023)  Social Connections: Unknown (06/19/2021)   Received from [facility], [facility]  Stress: Stress Concern Present (02/26/2023)  Tobacco Use: Low Risk  (02/18/2023)  Health Literacy: Adequate Health Literacy (02/26/2023)    SDOH Interventions: Financial Resources:  Financial Strain Interventions: Other (Comment) (pt mother receives Tree surgeon, pt disability pending- single income making ends meet) Tree surgeon for Comptroller Insecurity:  Food Insecurity Interventions: Programmer, applications Provided (recieves SNAP will mail [employer])  Housing Insecurity:  Housing Interventions: Intervention Not Indicated  Transportation:   Transportation Interventions: Intervention Not Indicated, Payor Benefit    Other Care Navigation Interventions:     Patient expressed Mental Health concerns Yes, Referred to:  continue with her counselor at  [facility]; check in with [facility] regarding social work/group support; caregiver support/respite programs   Follow-up plan:   LCSW has sent pt the following: my card, senior resources of [place] and [employer], [employer] pantries near [place], a copy of her upcoming February appts, respite care and day program information, and information from [employer] on [employer]. I will f/u to ensure pt has received these and offer additional support.

## 2023-03-05 ENCOUNTER — Telehealth: Payer: Self-pay | Admitting: Licensed Clinical Social Worker

## 2023-03-05 NOTE — Telephone Encounter (Signed)
H&V Care Navigation CSW Progress Note  Clinical Social Worker contacted patient by phone to f/u on community resources sent to pt. No answer today again at [phone]. Will re-attempt again as able, left voicemail.    Patient is participating in a Managed Medicaid Plan:  Yes- [employer]  SDOH Screenings   Food Insecurity: Food Insecurity Present (02/26/2023)  Housing: Low Risk  (02/26/2023)  Transportation Needs: No Transportation Needs (02/26/2023)  Utilities: Not At Risk (02/26/2023)  Depression (PHQ2-9): High Risk (02/06/2023)  Financial Resource Strain: Medium Risk (02/26/2023)  Social Connections: Unknown (06/19/2021)   Received from [facility], [facility]  Stress: Stress Concern Present (02/26/2023)  Tobacco Use: Low Risk  (02/18/2023)  Health Literacy: Adequate Health Literacy (02/26/2023)    [name], MSW, LCSW Clinical Social Worker II [facility] Heart/Vascular Care Navigation  [phone]- work cell phone (preferred) [phone]- desk phone

## 2023-03-07 ENCOUNTER — Telehealth (HOSPITAL_BASED_OUTPATIENT_CLINIC_OR_DEPARTMENT_OTHER): Payer: Self-pay | Admitting: Licensed Clinical Social Worker
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
## 2023-03-10 ENCOUNTER — Telehealth: Payer: Self-pay | Admitting: Licensed Clinical Social Worker

## 2023-03-10 NOTE — Telephone Encounter (Signed)
H&V Care Navigation CSW Progress Note  Clinical Social Worker contacted patient by phone to f/u as discussed after the weekend regarding resources and to provide support as pt currently managing several stressors. No answer this afternoon at [phone]. Left voicemail, will re-attempt again as able.  Patient is participating in a Managed Medicaid Plan:  Yes- [employer]  SDOH Screenings   Food Insecurity: Food Insecurity Present (02/26/2023)  Housing: Low Risk  (02/26/2023)  Transportation Needs: No Transportation Needs (02/26/2023)  Utilities: Not At Risk (02/26/2023)  Depression (PHQ2-9): High Risk (02/06/2023)  Financial Resource Strain: Medium Risk (02/26/2023)  Social Connections: Unknown (06/19/2021)   [facility], [facility]  Stress: Stress Concern Present (02/26/2023)  Tobacco Use: Low Risk  (02/18/2023)  Health Literacy: Adequate Health Literacy (02/26/2023)    [name], MSW, LCSW Clinical Social Worker II [facility] Health Heart/Vascular Care Navigation  [phone]- work cell phone (preferred) [phone]- desk phone

## 2023-03-11 ENCOUNTER — Telehealth: Payer: Self-pay | Admitting: Licensed Clinical Social Worker

## 2023-03-11 NOTE — Telephone Encounter (Signed)
 H&V Care Navigation CSW Progress Note  Clinical Social Worker contacted patient by phone to f/u as discussed after the weekend regarding resources and to provide support as pt currently managing several stressors. No answer this morning at [phone]. Left 2nd voicemail, will re-attempt again as able.   Patient is participating in a Managed Medicaid Plan:  Yes- [employer]  SDOH Screenings   Food Insecurity: Food Insecurity Present (02/26/2023)  Housing: Low Risk  (02/26/2023)  Transportation Needs: No Transportation Needs (02/26/2023)  Utilities: Not At Risk (02/26/2023)  Depression (PHQ2-9): High Risk (02/06/2023)  Financial Resource Strain: Medium Risk (02/26/2023)  Social Connections: Unknown (06/19/2021)   Received from [facility], [facility]  Stress: Stress Concern Present (02/26/2023)  Tobacco Use: Low Risk  (02/18/2023)  Health Literacy: Adequate Health Literacy (02/26/2023)    Marit Lark, MSW, LCSW Clinical Social Worker II [facility] Health Heart/Vascular Care Navigation  [phone]- work cell phone (preferred) [phone]- desk phone

## 2023-03-17 ENCOUNTER — Telehealth: Payer: Self-pay | Admitting: Licensed Clinical Social Worker

## 2023-03-17 NOTE — Telephone Encounter (Signed)
 H&V Care Navigation CSW Progress Note  Clinical Social Worker contacted patient by phone to f/u regarding resources and to provide support as pt currently managing several stressors. No answer this morning at [phone]. Left 3rd voicemail, remain available as needed to assist with any additional questions/concerns.    Patient is participating in a Managed Medicaid Plan:  Yes- [employer]  SDOH Screenings   Food Insecurity: Food Insecurity Present (02/26/2023)  Housing: Low Risk  (02/26/2023)  Transportation Needs: No Transportation Needs (02/26/2023)  Utilities: Not At Risk (02/26/2023)  Depression (PHQ2-9): High Risk (02/06/2023)  Financial Resource Strain: Medium Risk (02/26/2023)  Social Connections: Unknown (06/19/2021)   Received from [facility], [facility]  Stress: Stress Concern Present (02/26/2023)  Tobacco Use: Low Risk  (02/18/2023)  Health Literacy: Adequate Health Literacy (02/26/2023)   Nathen Balder, MSW, LCSW Clinical Social Worker II [facility] Health Heart/Vascular Care Navigation  [phone]- work cell phone (preferred) [phone]- desk phone

## 2023-03-28 ENCOUNTER — Encounter (HOSPITAL_COMMUNITY): Payer: Self-pay

## 2023-03-31 ENCOUNTER — Telehealth (HOSPITAL_COMMUNITY): Payer: Self-pay | Admitting: Radiology

## 2023-03-31 ENCOUNTER — Encounter: Payer: Medicaid Other | Admitting: Physical Medicine and Rehabilitation

## 2023-03-31 NOTE — Telephone Encounter (Signed)
 Patient given detailed instructions per Myocardial Perfusion Study Information Sheet for the test on 2/28-25 at 2:30. Patient notified to arrive 15 minutes early and that it is imperative to arrive on time for appointment to keep from having the test rescheduled.  If you need to cancel or reschedule your appointment, please call the office within 24 hours of your appointment. . Patient verbalized understanding.EHK

## 2023-04-04 ENCOUNTER — Ambulatory Visit (HOSPITAL_COMMUNITY): Payer: Medicaid Other

## 2023-04-08 ENCOUNTER — Ambulatory Visit (HOSPITAL_COMMUNITY): Payer: Medicaid Other | Admitting: Psychiatry

## 2023-04-22 ENCOUNTER — Encounter (HOSPITAL_COMMUNITY): Payer: Self-pay

## 2023-04-22 ENCOUNTER — Ambulatory Visit (HOSPITAL_COMMUNITY): Payer: Medicaid Other | Admitting: Psychiatry

## 2023-05-02 ENCOUNTER — Ambulatory Visit (HOSPITAL_COMMUNITY): Payer: Medicaid Other | Attending: Physician Assistant

## 2023-05-02 ENCOUNTER — Ambulatory Visit (HOSPITAL_COMMUNITY): Payer: Medicaid Other

## 2023-05-05 ENCOUNTER — Encounter (HOSPITAL_COMMUNITY): Payer: Self-pay | Admitting: Cardiology

## 2023-05-06 ENCOUNTER — Ambulatory Visit (INDEPENDENT_AMBULATORY_CARE_PROVIDER_SITE_OTHER): Payer: Medicaid Other | Admitting: Psychiatry

## 2023-05-06 DIAGNOSIS — F411 Generalized anxiety disorder: Secondary | ICD-10-CM | POA: Diagnosis not present

## 2023-05-06 DIAGNOSIS — F4310 Post-traumatic stress disorder, unspecified: Secondary | ICD-10-CM

## 2023-05-06 DIAGNOSIS — F331 Major depressive disorder, recurrent, moderate: Secondary | ICD-10-CM
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
## 2023-05-20 ENCOUNTER — Telehealth (HOSPITAL_COMMUNITY): Payer: Self-pay | Admitting: Psychiatry

## 2023-05-20 ENCOUNTER — Ambulatory Visit (HOSPITAL_COMMUNITY): Payer: Medicaid Other | Admitting: Psychiatry

## 2023-05-20 NOTE — Telephone Encounter (Signed)
Therapist attempted to contact patient via phone regarding scheduled in office appointment and received voicemail recording.  Therapist left message indicating attempt and requesting patient call office. 

## 2023-05-24 DIAGNOSIS — H6593 Unspecified nonsuppurative otitis media, bilateral: Secondary | ICD-10-CM | POA: Diagnosis not present

## 2023-05-24 NOTE — Progress Notes (Signed)
 Tobacco Use: Low Risk  (05/24/2023)   Patient History   . Smoking Tobacco Use: Never   . Smokeless Tobacco Use: Never   . Passive Exposure: Never

## 2023-06-03 ENCOUNTER — Ambulatory Visit (HOSPITAL_COMMUNITY): Payer: Medicaid Other | Admitting: Psychiatry

## 2023-07-07 ENCOUNTER — Encounter (HOSPITAL_COMMUNITY): Payer: Self-pay | Admitting: Psychiatry

## 2023-07-07 ENCOUNTER — Ambulatory Visit (HOSPITAL_COMMUNITY): Admitting: Psychiatry

## 2023-07-21 ENCOUNTER — Ambulatory Visit (HOSPITAL_COMMUNITY): Admitting: Psychiatry

## 2023-11-27 DIAGNOSIS — R300 Dysuria: Secondary | ICD-10-CM | POA: Diagnosis not present

## 2023-11-27 DIAGNOSIS — B3731 Acute candidiasis of vulva and vagina: Secondary | ICD-10-CM | POA: Diagnosis not present

## 2023-11-27 DIAGNOSIS — Z113 Encounter for screening for infections with a predominantly sexual mode of transmission: Secondary | ICD-10-CM | POA: Diagnosis not present

## 2023-11-27 DIAGNOSIS — N3001 Acute cystitis with hematuria: Secondary | ICD-10-CM | POA: Diagnosis not present

## 2023-11-27 NOTE — Progress Notes (Signed)
 Assessment and Plan:   Assessment & Plan Acute cystitis with hematuria Urinary tract infection Urinalysis indicated UTI with leukocytes present. Differential includes UTI and possible yeast infection. - Send urine for culture. - Prescribed Macrobid 100 mg twice daily for 7 days. - Advised to call if experiencing adverse effects such as vomiting or diarrhea.  Orders: .  nitrofurantoin, macrocrystal-monohydrate, (MACROBID) 100 MG capsule; Take 1 capsule (100 mg total) by mouth every twelve (12) hours for 7 days.  Dysuria  Orders: .  POCT urinalysis dipstick .  Urine Culture  Routine screening for STI (sexually transmitted infection) Sexually transmitted infection screening - Perform STD screening via urine test. Orders: .  CT, NG, TRICH VAG NAA ([facility])  Vaginal yeast infection Vulvovaginal candidiasis Possible yeast infection due to white discharge and overlapping symptoms with UTI. - Miconazole vaginal cream ordered to be used nightly for 7 days to treat suspected vaginal yeast infection.   Orders: .  miconazole (MICOTIN) 2 % vaginal cream; Insert 1 applicator into the vagina before bedtime for 7 days.      The following portions of the patient's history were reviewed and updated as appropriate: allergies, current medications, past family history, past medical history, past social history, past surgical history and problem list.   Subjective:   Patient ID: [name] is a 58 y.o. female Chief Complaint  Patient presents with  . Urinary Tract Infection    Patient sts she is having pressure, frequency and is having a pain that hurts when sitting. Patient sts she has had kidney infections in the past.      History of Present Illness [name] is a 58 year old female with a history of UTIs and kidney infections who presents with urinary frequency and dysuria.She experiences urinary frequency and burning during urination since Sunday, with symptoms  worsening. There is no hematuria, but she occasionally has lower back pain. She notices a white vaginal discharge without odor, first observed while walking the previous day.She denies vaginal itching or burning when not urinating and describes discomfort when sitting. There are no symptoms suggestive of STIs, such as yellow or green discharge. She has concerns about potential STI exposure due to her husband's infidelity.She is allergic to penicillins and sulfa drugs.     Urinary Tract Infection  This is a new problem. The current episode started in the past 7 days. The problem occurs every urination. The quality of the pain is described as burning. The patient is experiencing no pain. There has been no fever. Associated symptoms include a discharge, frequency and urgency. Pertinent negatives include no hematuria, nausea, possible pregnancy or vomiting. She has tried nothing for the symptoms.    ROS: Negative unless otherwise noted in HPI.  Objective:   Vital Signs:  BP 163/95   Pulse 66   Temp 36.7 C (98 F) (Oral)   Resp 20   Ht 167.6 cm (5' 6)   Wt 58.1 kg (128 lb)   SpO2 98%   BMI 20.66 kg/m   Body mass index is 20.66 kg/m. No LMP recorded. Patient has had a hysterectomy.  Results for orders placed or performed in visit on 11/27/23  POCT urinalysis dipstick  Result Value Ref Range   Color, UA Yellow    Clarity, UA Clear    Glucose, UA Negative Negative   Bilirubin, UA Negative Negative   Ketones, POC Negative Negative   Spec Grav, UA 1.025 1.005 - 1.030   Blood, UA Trace-intact (A) Negative  pH, UA 5.5 5.0 - 9.0   Protein, UA Negative Negative   Urobilinogen, UA 0.2 E.U./dL Negative (0.2 mg/dL)   Leukocytes, UA Large (A) Negative   Nitrite, UA Negative Negative   STRIP LOT NUMBER [id number]    STRIP LOT EXPIRATION 07/04/2024      Physical Exam Vitals and nursing note reviewed.  Constitutional:      General: She is not in acute distress.    Appearance: Normal  appearance. She is normal weight. She is not ill-appearing or toxic-appearing.  HENT:     Head: Normocephalic.     Right Ear: Tympanic membrane, ear canal and external ear normal.     Left Ear: Tympanic membrane, ear canal and external ear normal.     Nose: Nose normal.     Mouth/Throat:     Mouth: Mucous membranes are moist.     Pharynx: Oropharynx is clear.  Eyes:     Extraocular Movements: Extraocular movements intact.     Conjunctiva/sclera: Conjunctivae normal.     Pupils: Pupils are equal, round, and reactive to light.  Cardiovascular:     Rate and Rhythm: Normal rate and regular rhythm.     Heart sounds: Normal heart sounds.  Pulmonary:     Effort: Pulmonary effort is normal. No respiratory distress.     Breath sounds: Normal breath sounds.  Abdominal:     General: Abdomen is flat. There is no distension.     Palpations: Abdomen is soft.     Tenderness: There is abdominal tenderness in the suprapubic area. There is no right CVA tenderness or left CVA tenderness.  Musculoskeletal:        General: Normal range of motion.     Cervical back: Normal range of motion.  Skin:    General: Skin is warm and dry.  Neurological:     General: No focal deficit present.     Mental Status: She is alert and oriented to person, place, and time. Mental status is at baseline.  Psychiatric:        Mood and Affect: Mood normal.        Behavior: Behavior normal.        Thought Content: Thought content normal.        Judgment: Judgment normal.     Follow-up as Needed  and Follow-up with PCP  The use of abridge was used to help in the completion of this note. Patient was educated and verbalized consent of its use.   Disposition Upon Discharge:  1.  Routine symptom specific, illness specific and/or disease specific instructions were discussed with the patient and/or caregiver at length.  2.  The differential diagnosis for the patient's specific symptoms were reviewed and discussed with the  patient/caregiver at length; the patient/caregiver expressed understanding of all discussed and their relevant questions were all satisfactorily answered.  3.  Return to care should the presenting symptoms recur, persist, or worsen in any way; or in the alternative, if new symptoms or complaints develop.  4.  The patient and any family present were given verbal and/or written discharge instructions as clinically indicated and appropriate.  5.  Continue OTC medications as needed and tolerated for control of the currently reported symptoms, if no conflict with the currently prescribed medications.

## 2023-12-01 NOTE — Progress Notes (Signed)
 Please call patient and let her know she is on the correct antibiotic per her culture. Ensure she is feeling better. If not have her follow up with PCP. Ty

## 2023-12-01 NOTE — Telephone Encounter (Signed)
-----   Message from [place], [place] sent at 12/01/2023  8:55 AM EDT ----- Please call patient and let her know she is on the correct antibiotic per her culture. Ensure she is feeling better. If not have her follow up with PCP. Ty ----- Message ----- From: Kirk Sydelle SAUNDERS Sent: 11/27/2023  10:11 AM EDT To: [facility] Provider #

## 2023-12-01 NOTE — Telephone Encounter (Signed)
 Please call patient and let her know she is on the correct antibiotic per her culture. Ensure she is feeling better. If not have her follow up with PCP. No answer but left a message.

## 2023-12-17 DIAGNOSIS — N390 Urinary tract infection, site not specified: Secondary | ICD-10-CM | POA: Diagnosis not present

## 2023-12-17 DIAGNOSIS — Z113 Encounter for screening for infections with a predominantly sexual mode of transmission: Secondary | ICD-10-CM | POA: Diagnosis not present

## 2023-12-17 DIAGNOSIS — R8290 Unspecified abnormal findings in urine: Secondary | ICD-10-CM | POA: Diagnosis not present

## 2023-12-19 ENCOUNTER — Encounter (HOSPITAL_BASED_OUTPATIENT_CLINIC_OR_DEPARTMENT_OTHER): Payer: Self-pay

## 2023-12-19 ENCOUNTER — Observation Stay (HOSPITAL_BASED_OUTPATIENT_CLINIC_OR_DEPARTMENT_OTHER)
Admission: EM | Admit: 2023-12-19 | Discharge: 2023-12-21 | Disposition: A | Discharge status: Home / Self Care | Source: Ambulatory Visit | Attending: Emergency Medicine | Admitting: Emergency Medicine

## 2023-12-19 ENCOUNTER — Emergency Department (HOSPITAL_BASED_OUTPATIENT_CLINIC_OR_DEPARTMENT_OTHER)

## 2023-12-19 DIAGNOSIS — N12 Tubulo-interstitial nephritis, not specified as acute or chronic: Principal | ICD-10-CM | POA: Diagnosis present

## 2023-12-19 DIAGNOSIS — F419 Anxiety disorder, unspecified: Secondary | ICD-10-CM | POA: Diagnosis not present

## 2023-12-19 DIAGNOSIS — G35D Multiple sclerosis, unspecified: Secondary | ICD-10-CM

## 2023-12-19 DIAGNOSIS — N390 Urinary tract infection, site not specified: Secondary | ICD-10-CM | POA: Diagnosis present

## 2023-12-19 DIAGNOSIS — N10 Acute tubulo-interstitial nephritis: Principal | ICD-10-CM | POA: Insufficient documentation

## 2023-12-19 DIAGNOSIS — Z743 Need for continuous supervision: Secondary | ICD-10-CM | POA: Diagnosis not present

## 2023-12-19 DIAGNOSIS — F32A Depression, unspecified: Secondary | ICD-10-CM | POA: Insufficient documentation

## 2023-12-19 DIAGNOSIS — M549 Dorsalgia, unspecified: Secondary | ICD-10-CM | POA: Diagnosis not present

## 2023-12-19 DIAGNOSIS — G20C Parkinsonism, unspecified: Secondary | ICD-10-CM | POA: Diagnosis not present

## 2023-12-19 DIAGNOSIS — R509 Fever, unspecified: Secondary | ICD-10-CM | POA: Diagnosis not present

## 2023-12-19 LAB — URINALYSIS, ROUTINE W REFLEX MICROSCOPIC
Bilirubin Urine: NEGATIVE
Glucose, UA: NEGATIVE mg/dL
Ketones, ur: NEGATIVE mg/dL
Nitrite: NEGATIVE
Protein, ur: NEGATIVE mg/dL
Specific Gravity, Urine: <1.005 — ABNORMAL LOW (ref 1.005–1.030)
pH: 6.5 (ref 5.0–8.0)

## 2023-12-19 LAB — CBC WITH DIFFERENTIAL/PLATELET
Abs Immature Granulocytes: 0.01 K/uL (ref 0.00–0.07)
Basophils Absolute: 0 K/uL (ref 0.0–0.1)
Basophils Relative: 0 %
Eosinophils Absolute: 0.1 K/uL (ref 0.0–0.5)
Eosinophils Relative: 1 %
HCT: 38.5 % (ref 36.0–46.0)
Hemoglobin: 13 g/dL (ref 12.0–15.0)
Immature Granulocytes: 0 %
Lymphocytes Relative: 18 %
Lymphs Abs: 1.6 K/uL (ref 0.7–4.0)
MCH: 32.9 pg (ref 26.0–34.0)
MCHC: 33.8 g/dL (ref 30.0–36.0)
MCV: 97.5 fL (ref 80.0–100.0)
Monocytes Absolute: 0.7 K/uL (ref 0.1–1.0)
Monocytes Relative: 8 %
Neutro Abs: 6.1 K/uL (ref 1.7–7.7)
Neutrophils Relative %: 73 %
Platelets: 207 K/uL (ref 150–400)
RBC: 3.95 MIL/uL (ref 3.87–5.11)
RDW: 11.9 % (ref 11.5–15.5)
WBC: 8.4 K/uL (ref 4.0–10.5)
nRBC: 0 % (ref 0.0–0.2)

## 2023-12-19 LAB — BASIC METABOLIC PANEL WITH GFR
Anion gap: 13 (ref 5–15)
BUN: 9 mg/dL (ref 6–20)
CO2: 27 mmol/L (ref 22–32)
Calcium: 10 mg/dL (ref 8.9–10.3)
Chloride: 102 mmol/L (ref 98–111)
Creatinine, Ser: 0.81 mg/dL (ref 0.44–1.00)
GFR, Estimated: >60 mL/min (ref >60)
Glucose, Bld: 106 mg/dL — ABNORMAL HIGH (ref 70–99)
Potassium: 4.1 mmol/L (ref 3.5–5.1)
Sodium: 142 mmol/L (ref 135–145)

## 2023-12-19 MED ORDER — ACETAMINOPHEN 325 MG PO TABS: 650.0000 mg | ORAL_TABLET | Freq: Four times a day (QID) | ORAL | Status: DC | PRN

## 2023-12-19 MED ORDER — LORAZEPAM 1 MG PO TABS
0.5000 mg | ORAL_TABLET | Freq: Once | ORAL | Status: AC
  Administered 2023-12-19: 0.5 mg via ORAL
  Filled 2023-12-19: qty 1

## 2023-12-19 MED ORDER — ORAL CARE MOUTH RINSE: 15.0000 mL | OROMUCOSAL | Status: DC | PRN

## 2023-12-19 MED ORDER — KETOROLAC TROMETHAMINE 15 MG/ML IJ SOLN
15.0000 mg | Freq: Three times a day (TID) | INTRAMUSCULAR | Status: AC | PRN
  Administered 2023-12-20: 15 mg via INTRAVENOUS
  Filled 2023-12-19: qty 1

## 2023-12-19 MED ORDER — ACETAMINOPHEN 325 MG PO TABS
650.0000 mg | ORAL_TABLET | Freq: Once | ORAL | Status: AC
  Administered 2023-12-19: 650 mg via ORAL
  Filled 2023-12-19: qty 2

## 2023-12-19 MED ORDER — ONDANSETRON HCL 4 MG/2ML IJ SOLN: 4.0000 mg | Freq: Four times a day (QID) | INTRAMUSCULAR | Status: DC | PRN

## 2023-12-19 MED ORDER — LEVOFLOXACIN IN D5W 750 MG/150ML IV SOLN
750.0000 mg | Freq: Once | INTRAVENOUS | Status: AC
  Administered 2023-12-19: 750 mg via INTRAVENOUS
  Filled 2023-12-19: qty 150

## 2023-12-19 MED ORDER — ENOXAPARIN SODIUM 40 MG/0.4ML IJ SOSY
40.0000 mg | PREFILLED_SYRINGE | INTRAMUSCULAR | Status: DC
  Administered 2023-12-20 – 2023-12-21 (×2): 40 mg via SUBCUTANEOUS
  Filled 2023-12-19 (×2): qty 0.4

## 2023-12-19 MED ORDER — ONDANSETRON HCL 4 MG PO TABS: 4.0000 mg | ORAL_TABLET | Freq: Four times a day (QID) | ORAL | Status: DC | PRN

## 2023-12-19 MED ORDER — KETOROLAC TROMETHAMINE 15 MG/ML IJ SOLN
15.0000 mg | Freq: Once | INTRAMUSCULAR | Status: AC
  Administered 2023-12-19: 15 mg via INTRAVENOUS
  Filled 2023-12-19: qty 1

## 2023-12-19 MED ORDER — LEVOFLOXACIN IN D5W 750 MG/150ML IV SOLN
750.0000 mg | INTRAVENOUS | Status: DC
  Administered 2023-12-20: 750 mg via INTRAVENOUS
  Filled 2023-12-19 (×2): qty 150

## 2023-12-19 NOTE — Progress Notes (Signed)
 ED Pharmacy Antibiotic Sign Off An antibiotic consult was received from an ED provider for levofloxacin per pharmacy dosing for UTI. A chart review was completed to assess appropriateness.  The following one time order(s) were placed per pharmacy consult:  levofloxacin 750 mg x 1 dose  Further antibiotic and/or antibiotic pharmacy consults should be ordered by the admitting provider if indicated.   Thank you for allowing pharmacy to be a part of this patient's care.   Dorn Buttner, PharmD, BCPS 12/19/2023 5:42 PM ED Clinical Pharmacist -  [phone]

## 2023-12-19 NOTE — ED Triage Notes (Signed)
 Patient reports recurrent UTI. She was started on Cipro  on Wednesday by her OB/GYN. She says she has had a fever on and off since then. Her OB recommended she come here.

## 2023-12-19 NOTE — ED Provider Notes (Signed)
 [place] EMERGENC[facility] DEPARTMENT AT [facility] Provider Note   CSN: [id number] Arrival date & time: 12/19/23  1346     Patient presents with: Recurrent UTI   [name] is a 58 y.o. female.   58 year old female presenting with urinary symptoms and fever.  Patient notes that she was treated for a urinary tract infection approximately 2-1/2 weeks ago with Macrobid, she then continued to have urinary symptoms and was started on ciprofloxacin  by her OB/GYN on Wednesday of this week.  She is concerned because she continues to have some dysuria and pelvic pressure sensation, she also endorses nearly daily fevers for 1 month with shaking chills, with a fever of 102F prior to presenting to the ED today, she did take a Tylenol  which has brought her fever down.  She endorses nausea.  She denies any unintentional weight loss, vomiting, abdominal pain.       Prior to Admission medications   Medication Sig Start Date End Date Taking? Authorizing Provider  baclofen  (LIORESAL ) 10 MG tablet TAKE 1/2 (ONE-HALF) TABLET BY MOUTH 4 TIMES DAILY FOR 7 DAYS THEN TAKE 1 TABLET 4 TIMES DAILY IF NEED BE, AFTER ANOTHER ONE WEEK, CAN INCREASE TO 1 & 1/2 (ONE & ONE-HALF) TABLETS 4 TIMES DAILY FOR GENERALIZED DYSTONIA Patient not taking: Reported on 02/18/2023 05/05/22   [provider]  clonazePAM  (KLONOPIN ) 1 MG tablet Take 1 tablet (1 mg total) by mouth 2 (two) times daily. 05/24/22   [name], Alyssa M, [name]  dantrolene  (DANTRIUM ) 50 MG capsule TAKE 1 CAPSULE BY MOUTH AT BEDTIME FOR 7 DAYS, THEN 1 CAPSULE TWICE DAILY FOR 7 DAYS, THEN 1 CAPSULE THREE TIMES DAILY FOR 7 DAYS, THEN 2 CAPSULES TWICE DAILY FOR DYSTONIA. THERE ARE NO OTHER MEDS THAT TREAT DYSTONIA THAN DANTROLENE  AND KEPPRA . Patient not taking: Reported on 02/18/2023 05/19/22   [provider]  fluticasone  (FLONASE ) 50 MCG/ACT nasal spray Place 1 spray into both nostrils 2 (two) times daily. Patient not taking: Reported  on 02/18/2023 09/30/22   Jordan, Betty G, MD  levETIRAcetam  (KEPPRA ) 250 MG tablet Take by mouth. Patient not taking: Reported on 02/18/2023 05/05/22   [provider]  mirtazapine  (REMERON ) 7.5 MG tablet Take 1 tablet (7.5 mg total) by mouth at bedtime. Patient not taking: Reported on 02/18/2023 06/21/22   [name], Alyssa M, [name]  ondansetron  (ZOFRAN ) 4 MG tablet Take 1 tablet (4 mg total) by mouth every 8 (eight) hours as needed for nausea or vomiting. Patient not taking: Reported on 02/18/2023 12/21/21   [name], [name], MD  promethazine  (PHENERGAN ) 12.5 MG tablet Take 1 tablet (12.5 mg total) by mouth every 6 (six) hours as needed for nausea or vomiting. Patient not taking: Reported on 02/18/2023 03/15/22   [name], [name], MD  sertraline  (ZOLOFT ) 50 MG tablet Take 1 tablet (50 mg total) by mouth at bedtime. Patient not taking: Reported on 02/18/2023 05/24/22   [name], Mardy HERO, [name]  tiZANidine  (ZANAFLEX ) 4 MG tablet Indications: muscle spasm Patient not taking: Reported on 02/18/2023 09/09/17   [provider]    Allergies: Penicillins, Sulfa antibiotics, and Iodinated contrast media    Review of Systems  Updated Vital Signs  Vitals:   12/19/23 1400 12/19/23 1630  BP: 130/78 116/75  Pulse: 74 79  Resp: 18 18  Temp: 98 F (36.7 C) 99.2 F (37.3 C)  TempSrc: Temporal Oral  SpO2: 98% 98%     Physical Exam Vitals and nursing note reviewed.  Constitutional:  Comments: Shaking chills  HENT:     Head: Normocephalic.  Eyes:     Extraocular Movements: Extraocular movements intact.  Cardiovascular:     Rate and Rhythm: Normal rate.  Pulmonary:     Effort: Pulmonary effort is normal.  Abdominal:     Palpations: Abdomen is soft.     Tenderness: There is no abdominal tenderness. There is right CVA tenderness (mild). There is no left CVA tenderness or guarding.  Musculoskeletal:     Cervical back: Normal range of motion.     Comments: Moves all extremities  spontaneously without difficulty  Skin:    General: Skin is warm and dry.  Neurological:     Mental Status: She is alert and oriented to person, place, and time.     (all labs ordered are listed, but only abnormal results are displayed) Labs Reviewed  URINALYSIS, ROUTINE W REFLEX MICROSCOPIC - Abnormal; Notable for the following components:      Result Value   Color, Urine COLORLESS (*)    APPearance HAZY (*)    Specific Gravity, Urine <1.005 (*)    Hgb urine dipstick SMALL (*)    Leukocytes,Ua LARGE (*)    Bacteria, UA RARE (*)    All other components within normal limits  BASIC METABOLIC PANEL WITH GFR - Abnormal; Notable for the following components:   Glucose, Bld 106 (*)    All other components within normal limits  URINE CULTURE  CULTURE, BLOOD (ROUTINE X 2)  CULTURE, BLOOD (ROUTINE X 2)  CBC WITH DIFFERENTIAL/PLATELET    EKG: None  Radiology: CT ABDOMEN PELVIS WO CONTRAST Result Date: 12/19/2023 CLINICAL DATA:  One month of frequent fevers, chills with urinary symptoms. EXAM: CT ABDOMEN AND PELVIS WITHOUT CONTRAST TECHNIQUE: Multidetector CT imaging of the abdomen and pelvis was performed following the standard protocol without IV contrast. RADIATION DOSE REDUCTION: This exam was performed according to the departmental dose-optimization program which includes automated exposure control, adjustment of the mA and/or kV according to patient size and/or use of iterative reconstruction technique. COMPARISON:  April 27, 2018 FINDINGS: Lower chest: No acute abnormality. Hepatobiliary: No focal liver abnormality is seen. No gallstones, gallbladder wall thickening, or biliary dilatation. Pancreas: Unremarkable. No pancreatic ductal dilatation or surrounding inflammatory changes. Spleen: Normal in size without focal abnormality. Adrenals/Urinary Tract: Adrenal glands are unremarkable. Kidneys are normal, without renal calculi, focal lesion, or hydronephrosis. Mild perinephric  inflammatory fat stranding is seen along the lower pole of the right kidney. Bladder is unremarkable. Stomach/Bowel: Stomach is within normal limits. Appendix appears normal. No evidence of bowel wall thickening, distention, or inflammatory changes. Vascular/Lymphatic: No significant vascular findings are present. No enlarged abdominal or pelvic lymph nodes. Reproductive: Status post hysterectomy. No adnexal masses. Other: No abdominal wall hernia or abnormality. No abdominopelvic ascites. Musculoskeletal: No acute or significant osseous findings. IMPRESSION: 1. Mild perinephric inflammatory fat stranding along the lower pole of the right kidney, which may represent sequelae associated with acute pyelonephritis. Correlation with urinalysis is recommended. 2. Evidence of prior hysterectomy. Electronically Signed   By: Suzen Dials M.D.   On: 12/19/2023 16:19     Procedures   Medications Ordered in the ED  levofloxacin (LEVAQUIN) IVPB 750 mg (750 mg Intravenous New Bag/Given 12/19/23 1822)  acetaminophen  (TYLENOL ) tablet 650 mg (650 mg Oral Given 12/19/23 1756)  Medical Decision Making This patient presents to the ED for concern of recurrent UTI, this involves an extensive number of treatment options, and is a complaint that carries with it a high risk of complications and morbidity.  The differential diagnosis includes UTI complicated by outpatient antibiotic failure x 2, pyelonephritis, nephro versus ureterolithiasis, renal abscess   Co morbidities that complicate the patient evaluation  Recurrent UTIs   Additional history obtained:  Additional history obtained from record review External records from outside source obtained and reviewed including recent urgent care/OB/GYN note   Lab Tests:  I Ordered, and personally interpreted labs.  The pertinent results include: Urinalysis notable for small RBCs with large leukocytes and rare bacteria, will  send for urine culture.  CBC unremarkable, no leukocytosis.  BMP unremarkable.    Imaging Studies ordered:  I ordered imaging studies including CT abdomen/pelvis without contrast  I independently visualized and interpreted imaging which showed 1. Mild perinephric inflammatory fat stranding along the lower pole of the right kidney, which may represent sequelae associated with acute pyelonephritis. Correlation with urinalysis is recommended. 2. Evidence of prior hysterectomy.  I agree with the radiologist interpretation   Cardiac Monitoring: / EKG:  The patient was maintained on a cardiac monitor.  I personally viewed and interpreted the cardiac monitored which showed an underlying rhythm of: NSR   Consultations Obtained:  I requested consultation with the pharmacy and hospitalist,  and discussed lab and imaging findings as well as pertinent plan - they recommend: I consulted pharmacy to discuss antibiotic choice for this patient, she has an anaphylactic reaction to penicillins, there is no evidence in the chart that she has ever been given or tolerated a cephalosporin.  Pharmacist feels that given patient has only been on Cipro  for 2 days it is unlikely to be representative of a true treatment failure, recommends switching to Levaquin or aztreonam for further treatment. I spoke with the hospitalist, Dr. Arthea, who is in agreement with this plan.   Problem List / ED Course / Critical interventions / Medication management  I ordered medication including IV Levaquin for pyelonephritis, Tylenol  for low-grade fever Reevaluation of the patient after these medicines showed that the patient improved I have reviewed the patients home medicines and have made adjustments as needed   Social Determinants of Health:  Depression   Test / Admission - Considered:  Physical exam is notable as above, patient does have some mild right flank tenderness on exam, remainder of the abdomen is soft and  nontender, she is demonstrating shaking chills, had a fever of 102F prior to presenting to the emergency department, not truly febrile here but remains symptomatic.  Urinalysis shows continued evidence of urinary tract infection, CT with findings consistent with right-sided pyelonephritis.  Given that patient has completed a course of Macrobid and has been on 2 days of ciprofloxacin  with continued worsening of her symptoms, I do feel that she may benefit from hospitalization for further management with IV antibiotics.    Amount and/or Complexity of Data Reviewed Labs: ordered. Radiology: ordered.  Risk OTC drugs. Prescription drug management.        Final diagnoses:  Pyelonephritis    ED Discharge Orders     None          Glendia Rocky SAILOR, [name] 12/19/23 1837    Ellouise Richerd POUR, [place] 12/19/23 2327

## 2023-12-19 NOTE — ED Notes (Signed)
 Blood cultures drawn x2 before starting antibiotics.

## 2023-12-19 NOTE — H&P (Signed)
 History and Physical    [name] FMW:[id number] DOB: Oct 28, 1965 DOA: 12/19/2023  PCP: Patient, No Pcp Per  Patient coming from: [facility] ED  Chief Complaint: Urinary symptoms  HPI: [name] is a 58 y.o. female with medical history significant of MS, Parkinson's disease, anxiety, depression, PTSD presenting with complaints of urinary symptoms.  Patient is endorsing 1 month history of dysuria, urinary frequency and urgency, and suprapubic discomfort.  She was seen by her PCP last month on October 23rd and was diagnosed with UTI and treated with a 7-day course of Macrobid.  She has continued to have urinary symptoms and for the past 1 week also having fevers, chills, nausea but no vomiting, and right sided flank pain.  She was started on ciprofloxacin  by her gynecologist 2 days ago but her symptoms did not improve.  No other complaints.  ED Course: Vital signs stable.  Labs showing no leukocytosis.  UA with large amount of leukocytes and microscopy showing 21-50 WBCs and rare bacteria.  Urine and blood cultures in process.  CT abdomen pelvis showing mild perinephric inflammatory fat stranding along the lower pole of the right kidney which may represent sequelae associated with acute pyelonephritis.  Patient was given Tylenol , Toradol , Ativan , and IV levofloxacin.  Review of Systems:  Review of Systems  All other systems reviewed and are negative.   Past Medical History:  Diagnosis Date   Anxiety    Cervical dystonia    Depression    Movement disorder    MS (multiple sclerosis)    Other specified cardiac arrhythmias 1990s   Panic attacks    Parkinson's disease (HCC)    Polyneuritis    PTSD (post-traumatic stress disorder)    Tremors of nervous system     Past Surgical History:  Procedure Laterality Date   ABDOMINAL HYSTERECTOMY  late 1990s   Dr. 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   ADENOIDECTOMY      FOOT FRACTURE SURGERY Right    OVARIAN CYST REMOVAL       reports that she has never smoked. She has never used smokeless tobacco. She reports that she does not currently use drugs after having used the following drugs: Cocaine and Marijuana. She reports that she does not drink alcohol.  Allergies  Allergen Reactions   Penicillins Anaphylaxis   Sulfa Antibiotics Rash and Anaphylaxis   Iodinated Contrast Media Other (See Comments) and Rash    unknown  unknown  unknown  unknown  unknown  unknown    Family History  Problem Relation Age of Onset   Bipolar disorder Mother    Diabetes Mother    Hypertension Mother    Parkinson's disease Father    Stroke Father    Anxiety disorder Sister    Movement disorder Sister    Heart attack Maternal Aunt    Parkinson's disease Paternal Aunt        3 daughters also have Parkinsons   Parkinson's disease Paternal Aunt    Parkinson's disease Paternal Aunt    Heart attack Maternal Uncle    Heart attack Maternal Uncle    Parkinson's disease Paternal Uncle    Heart attack Maternal Grandfather    Cerebrovascular Accident Maternal Grandfather    Cancer Maternal Grandmother        ovarian cancer   Heart attack Maternal Grandmother    Cerebrovascular Accident Maternal Grandmother    Stroke Paternal Grandfather  Parkinson's disease Paternal Grandfather    Stroke Paternal Grandmother    Diabetes Paternal Grandmother    Congestive Heart Failure Paternal Grandmother    Personality disorder Cousin    Bipolar disorder Son     Prior to Admission medications   Medication Sig Start Date End Date Taking? Authorizing Provider  ciprofloxacin  (CIPRO ) 500 MG tablet Take 500 mg by mouth 2 (two) times daily. 12/17/23  Yes [provider]  baclofen  (LIORESAL ) 10 MG tablet TAKE 1/2 (ONE-HALF) TABLET BY MOUTH 4 TIMES DAILY FOR 7 DAYS THEN TAKE 1 TABLET 4 TIMES DAILY IF NEED BE, AFTER ANOTHER ONE WEEK, CAN INCREASE TO 1 & 1/2 (ONE & ONE-HALF)  TABLETS 4 TIMES DAILY FOR GENERALIZED DYSTONIA Patient not taking: Reported on 02/18/2023 05/05/22   [provider]  clonazePAM  (KLONOPIN ) 1 MG tablet Take 1 tablet (1 mg total) by mouth 2 (two) times daily. 05/24/22   [name], Alyssa M, [name]  dantrolene  (DANTRIUM ) 50 MG capsule TAKE 1 CAPSULE BY MOUTH AT BEDTIME FOR 7 DAYS, THEN 1 CAPSULE TWICE DAILY FOR 7 DAYS, THEN 1 CAPSULE THREE TIMES DAILY FOR 7 DAYS, THEN 2 CAPSULES TWICE DAILY FOR DYSTONIA. THERE ARE NO OTHER MEDS THAT TREAT DYSTONIA THAN DANTROLENE  AND KEPPRA . Patient not taking: Reported on 02/18/2023 05/19/22   [provider]  fluticasone  (FLONASE ) 50 MCG/ACT nasal spray Place 1 spray into both nostrils 2 (two) times daily. Patient not taking: Reported on 02/18/2023 09/30/22   Jordan, Betty G, MD  levETIRAcetam  (KEPPRA ) 250 MG tablet Take by mouth. Patient not taking: Reported on 02/18/2023 05/05/22   [provider]  mirtazapine  (REMERON ) 7.5 MG tablet Take 1 tablet (7.5 mg total) by mouth at bedtime. Patient not taking: Reported on 02/18/2023 06/21/22   [name], Mardy HERO, [name]  ondansetron  (ZOFRAN ) 4 MG tablet Take 1 tablet (4 mg total) by mouth every 8 (eight) hours as needed for nausea or vomiting. Patient not taking: Reported on 02/18/2023 12/21/21   [name], [name], MD  promethazine  (PHENERGAN ) 12.5 MG tablet Take 1 tablet (12.5 mg total) by mouth every 6 (six) hours as needed for nausea or vomiting. Patient not taking: Reported on 02/18/2023 03/15/22   [name], [name], MD  sertraline  (ZOLOFT ) 50 MG tablet Take 1 tablet (50 mg total) by mouth at bedtime. Patient not taking: Reported on 02/18/2023 05/24/22   [name], Mardy HERO, [name]  tiZANidine  (ZANAFLEX ) 4 MG tablet Indications: muscle spasm Patient not taking: Reported on 02/18/2023 09/09/17   [provider]    Physical Exam: Vitals:   12/19/23 1400 12/19/23 1630 12/19/23 1900  BP: 130/78 116/75 121/73  Pulse: 74 79 79  Resp: 18 18 17   Temp: 98 F (36.7  C) 99.2 F (37.3 C) 98.3 F (36.8 C)  TempSrc: Temporal Oral Oral  SpO2: 98% 98% 97%    Physical Exam Vitals reviewed.  Constitutional:      General: She is not in acute distress. HENT:     Head: Normocephalic and atraumatic.  Eyes:     Extraocular Movements: Extraocular movements intact.  Cardiovascular:     Rate and Rhythm: Normal rate and regular rhythm.     Heart sounds: Normal heart sounds.  Pulmonary:     Effort: Pulmonary effort is normal. No respiratory distress.     Breath sounds: Normal breath sounds.  Abdominal:     General: Bowel sounds are normal. There is no distension.     Palpations: Abdomen is soft.     Tenderness: There is abdominal  tenderness. There is no guarding.     Comments: Mild suprapubic tenderness  Musculoskeletal:     Cervical back: Normal range of motion.     Right lower leg: No edema.     Left lower leg: No edema.  Skin:    General: Skin is warm and dry.  Neurological:     General: No focal deficit present.     Mental Status: She is alert and oriented to person, place, and time.     Labs on Admission: I have personally reviewed following labs and imaging studies  CBC: Recent Labs  Lab 12/19/23 1632  WBC 8.4  NEUTROABS 6.1  HGB 13.0  HCT 38.5  MCV 97.5  PLT 207   Basic Metabolic Panel: Recent Labs  Lab 12/19/23 1632  NA 142  K 4.1  CL 102  CO2 27  GLUCOSE 106*  BUN 9  CREATININE 0.81  CALCIUM 10.0   GFR: CrCl cannot be calculated (Unknown ideal weight.). Liver Function Tests: No results for input(s): AST, ALT, ALKPHOS, BILITOT, PROT, ALBUMIN in the last 168 hours. No results for input(s): LIPASE, AMYLASE in the last 168 hours. No results for input(s): AMMONIA in the last 168 hours. Coagulation Profile: No results for input(s): INR, PROTIME in the last 168 hours. Cardiac Enzymes: No results for input(s): CKTOTAL, CKMB, CKMBINDEX, TROPONINI in the last 168 hours. BNP (last 3  results) No results for input(s): PROBNP in the last 8760 hours. HbA1C: No results for input(s): HGBA1C in the last 72 hours. CBG: No results for input(s): GLUCAP in the last 168 hours. Lipid Profile: No results for input(s): CHOL, HDL, LDLCALC, TRIG, CHOLHDL, LDLDIRECT in the last 72 hours. Thyroid Function Tests: No results for input(s): TSH, T4TOTAL, FREET4, T3FREE, THYROIDAB in the last 72 hours. Anemia Panel: No results for input(s): VITAMINB12, FOLATE, FERRITIN, TIBC, IRON, RETICCTPCT in the last 72 hours. Urine analysis:    Component Value Date/Time   COLORURINE COLORLESS (A) 12/19/2023 1403   APPEARANCEUR HAZY (A) 12/19/2023 1403   APPEARANCEUR Cloudy (A) 04/17/2015 1141   LABSPEC <1.005 (L) 12/19/2023 1403   PHURINE 6.5 12/19/2023 1403   GLUCOSEU NEGATIVE 12/19/2023 1403   HGBUR SMALL (A) 12/19/2023 1403   BILIRUBINUR NEGATIVE 12/19/2023 1403   BILIRUBINUR N 05/01/2015 1210   BILIRUBINUR Negative 04/17/2015 1141   KETONESUR NEGATIVE 12/19/2023 1403   PROTEINUR NEGATIVE 12/19/2023 1403   UROBILINOGEN 0.2 05/01/2015 1210   NITRITE NEGATIVE 12/19/2023 1403   LEUKOCYTESUR LARGE (A) 12/19/2023 1403    Radiological Exams on Admission: CT ABDOMEN PELVIS WO CONTRAST Result Date: 12/19/2023 CLINICAL DATA:  One month of frequent fevers, chills with urinary symptoms. EXAM: CT ABDOMEN AND PELVIS WITHOUT CONTRAST TECHNIQUE: Multidetector CT imaging of the abdomen and pelvis was performed following the standard protocol without IV contrast. RADIATION DOSE REDUCTION: This exam was performed according to the departmental dose-optimization program which includes automated exposure control, adjustment of the mA and/or kV according to patient size and/or use of iterative reconstruction technique. COMPARISON:  April 27, 2018 FINDINGS: Lower chest: No acute abnormality. Hepatobiliary: No focal liver abnormality is seen. No gallstones, gallbladder wall  thickening, or biliary dilatation. Pancreas: Unremarkable. No pancreatic ductal dilatation or surrounding inflammatory changes. Spleen: Normal in size without focal abnormality. Adrenals/Urinary Tract: Adrenal glands are unremarkable. Kidneys are normal, without renal calculi, focal lesion, or hydronephrosis. Mild perinephric inflammatory fat stranding is seen along the lower pole of the right kidney. Bladder is unremarkable. Stomach/Bowel: Stomach is within normal limits. Appendix appears normal.  No evidence of bowel wall thickening, distention, or inflammatory changes. Vascular/Lymphatic: No significant vascular findings are present. No enlarged abdominal or pelvic lymph nodes. Reproductive: Status post hysterectomy. No adnexal masses. Other: No abdominal wall hernia or abnormality. No abdominopelvic ascites. Musculoskeletal: No acute or significant osseous findings. IMPRESSION: 1. Mild perinephric inflammatory fat stranding along the lower pole of the right kidney, which may represent sequelae associated with acute pyelonephritis. Correlation with urinalysis is recommended. 2. Evidence of prior hysterectomy. Electronically Signed   By: Suzen Dials M.D.   On: 12/19/2023 16:19    Assessment and Plan  Acute complicated UTI/pyelonephritis Failed outpatient oral antibiotic therapy.  Patient is endorsing urinary symptoms and right flank pain.  UA with evidence of pyuria and bacteriuria.  No fever, leukocytosis, or signs of sepsis.  CT showing mild perinephric inflammatory fat stranding along the lower pole of the right kidney which may represent sequelae associated with acute pyelonephritis.  Patient has high risk allergy to penicillins (anaphylaxis).  Continue IV levofloxacin.  Follow-up urine and blood cultures.  Continue pain management: Tylenol  as needed, Toradol  as needed.  Antiemetic as needed (EKG ordered to check QT interval).  Trend WBC count.  Multiple sclerosis Parkinson's disease Anxiety and  depression Patient confirms that she does not take any medications at home.  She will need outpatient follow-up.  DVT prophylaxis: Lovenox Code Status: DNR pre-arrest interventions desired (discussed with the patient) Level of care: Med-Surg Admission status: It is my clinical opinion that admission to INPATIENT is reasonable and necessary because of the expectation that this patient will require hospital care that crosses at least 2 midnights to treat this condition based on the medical complexity of the problems presented.  Given the aforementioned information, the predictability of an adverse outcome is felt to be significant.  Editha Ram MD [facility]  If 7PM-7AM, please contact night-coverage [email or web address]  12/19/2023, 10:27 PM

## 2023-12-20 ENCOUNTER — Inpatient Hospital Stay (HOSPITAL_COMMUNITY)

## 2023-12-20 DIAGNOSIS — N12 Tubulo-interstitial nephritis, not specified as acute or chronic: Secondary | ICD-10-CM | POA: Diagnosis not present

## 2023-12-20 DIAGNOSIS — N10 Acute tubulo-interstitial nephritis: Secondary | ICD-10-CM | POA: Diagnosis not present

## 2023-12-20 DIAGNOSIS — F32A Depression, unspecified: Secondary | ICD-10-CM | POA: Diagnosis not present

## 2023-12-20 DIAGNOSIS — G35D Multiple sclerosis, unspecified: Secondary | ICD-10-CM | POA: Diagnosis not present

## 2023-12-20 DIAGNOSIS — F419 Anxiety disorder, unspecified: Secondary | ICD-10-CM | POA: Diagnosis not present

## 2023-12-20 DIAGNOSIS — G20C Parkinsonism, unspecified: Secondary | ICD-10-CM | POA: Diagnosis not present

## 2023-12-20 LAB — COMPREHENSIVE METABOLIC PANEL WITH GFR
ALT: 9 U/L (ref 0–44)
AST: 17 U/L (ref 15–41)
Albumin: 4.1 g/dL (ref 3.5–5.0)
Alkaline Phosphatase: 75 U/L (ref 38–126)
Anion gap: 12 (ref 5–15)
BUN: 13 mg/dL (ref 6–20)
CO2: 27 mmol/L (ref 22–32)
Calcium: 9.8 mg/dL (ref 8.9–10.3)
Chloride: 105 mmol/L (ref 98–111)
Creatinine, Ser: 0.83 mg/dL (ref 0.44–1.00)
GFR, Estimated: >60 mL/min (ref >60)
Glucose, Bld: 112 mg/dL — ABNORMAL HIGH (ref 70–99)
Potassium: 4.1 mmol/L (ref 3.5–5.1)
Sodium: 144 mmol/L (ref 135–145)
Total Bilirubin: 0.5 mg/dL (ref 0.0–1.2)
Total Protein: 6.8 g/dL (ref 6.5–8.1)

## 2023-12-20 LAB — CBC
HCT: 36 % (ref 36.0–46.0)
Hemoglobin: 12.2 g/dL (ref 12.0–15.0)
MCH: 32.9 pg (ref 26.0–34.0)
MCHC: 33.9 g/dL (ref 30.0–36.0)
MCV: 97 fL (ref 80.0–100.0)
Platelets: 178 K/uL (ref 150–400)
RBC: 3.71 MIL/uL — ABNORMAL LOW (ref 3.87–5.11)
RDW: 11.8 % (ref 11.5–15.5)
WBC: 6.3 K/uL (ref 4.0–10.5)
nRBC: 0 % (ref 0.0–0.2)

## 2023-12-20 LAB — URINE CULTURE: Culture: NO GROWTH

## 2023-12-20 MED ORDER — POTASSIUM CHLORIDE IN NACL 20-0.9 MEQ/L-% IV SOLN
INTRAVENOUS | Status: AC
  Filled 2023-12-20 (×2): qty 1000

## 2023-12-20 MED ORDER — DIPHENHYDRAMINE HCL 25 MG PO CAPS
25.0000 mg | ORAL_CAPSULE | Freq: Three times a day (TID) | ORAL | Status: DC | PRN
  Administered 2023-12-20 – 2023-12-21 (×2): 25 mg via ORAL
  Filled 2023-12-20 (×2): qty 1

## 2023-12-20 MED ORDER — HYDROCORTISONE 0.5 % EX CREA
TOPICAL_CREAM | Freq: Three times a day (TID) | CUTANEOUS | Status: DC
  Filled 2023-12-20: qty 28.35

## 2023-12-20 NOTE — Plan of Care (Signed)
  Problem: Health Behavior/Discharge Planning: Goal: Ability to manage health-related needs will improve Outcome: Progressing   Problem: Activity: Goal: Risk for activity intolerance will decrease Outcome: Progressing   Problem: Nutrition: Goal: Adequate nutrition will be maintained Outcome: Progressing   Problem: Coping: Goal: Level of anxiety will decrease Outcome: Progressing   Problem: Safety: Goal: Ability to remain free from injury will improve Outcome: Progressing

## 2023-12-20 NOTE — Progress Notes (Signed)
 PROGRESS NOTE    ADILENNE [name]  FMW:[id number] DOB: [birth year] DOA: 12/19/2023 PCP: [name], No Pcp Per  Outpatient Specialists:     Brief Narrative:  [name] is a 58 year old female past medical history significant for MS, Parkinson's disease, anxiety, depression, and PTSD.  [name] was admitted with 1 month history of dysuria, urinary frequency and urgency.  [name] was diagnosed with UTI by the PCP on 11/27/2023, treated with 7-day course of Macrobid.  [name] continued to have urinary symptoms, fevers, chills and nausea.  Right-sided back pain (actually right lower back area).  UA revealed leukocytosis.  CT abdomen pelvis showing mild perinephric inflammatory fat stranding along the lower pole of the right kidney which may represent sequelae associated with acute pyelonephritis.   12/20/2023: [name] seen alongside [name]'s nurse.  Cultures are pending.  [name] is on IV Levaquin (severe allergic reaction to penicillins).   Assessment & Plan:   Principal Problem:   Pyelonephritis Active Problems:   Primary parkinsonism (HCC)   Multiple sclerosis   Acute complicated UTI/pyelonephritis -Likely failed outpatient oral antibiotic therapy.   - [name] continues to report urinary symptoms.   - Follow cultures.  Cultures may not grow any organisms as [name] has been on antibiotics. - Continue IV Levaquin. - Follow symptomatic improvement as well.    Multiple sclerosis Parkinson's disease Anxiety and depression    DVT prophylaxis: Subcutaneous Lovenox Code Status: DO NOT RESUSCITATE Family Communication:  Disposition Plan: Inpatient   Consultants:  None  Procedures:  None  Antimicrobials:  IV Levaquin   Subjective: Right lower back pain. No fever or chills.  Objective: Vitals:   12/19/23 2234 12/20/23 0236 12/20/23 0612 12/20/23 1038  BP: 93/66 101/61 100/62 95/60  Pulse: 62 (!) 55 63 62  Resp: 18 18 18 18   Temp: 98.7 F (37.1 C) 98 F (36.7 C) 99.4  F (37.4 C) 98.8 F (37.1 C)  TempSrc: Oral Oral Oral Oral  SpO2: 98% 99% 99% 99%  Weight: 61.6 kg     Height: 5' 6 (1.676 m)       Intake/Output Summary (Last 24 hours) at 12/20/2023 1110 Last data filed at 12/19/2023 2122 Gross per 24 hour  Intake 137.27 ml  Output --  Net 137.27 ml   Filed Weights   12/19/23 2234  Weight: 61.6 kg    Examination:  General exam: Appears calm and comfortable.  Head tremors. Respiratory system: Clear to auscultation. Respiratory effort normal. Cardiovascular system: S1 & S2 heard Gastrointestinal system: Abdomen is soft and nontender.  Central nervous system: Alert and oriented.  Tremors. Extremities: No leg edema.  Data Reviewed: I have personally reviewed following labs and imaging studies  CBC: Recent Labs  Lab 12/19/23 1632 12/20/23 0545  WBC 8.4 6.3  NEUTROABS 6.1  --   HGB 13.0 12.2  HCT 38.5 36.0  MCV 97.5 97.0  PLT 207 178   Basic Metabolic Panel: Recent Labs  Lab 12/19/23 1632 12/20/23 0545  NA 142 144  K 4.1 4.1  CL 102 105  CO2 27 27  GLUCOSE 106* 112*  BUN 9 13  CREATININE 0.81 0.83  CALCIUM 10.0 9.8   GFR: Estimated Creatinine Clearance: 70 mL/min (by C-G formula based on SCr of 0.83 mg/dL). Liver Function Tests: Recent Labs  Lab 12/20/23 0545  AST 17  ALT 9  ALKPHOS 75  BILITOT 0.5  PROT 6.8  ALBUMIN 4.1   No results for input(s): LIPASE, AMYLASE in the last 168 hours. No results for input(s): AMMONIA  in the last 168 hours. Coagulation Profile: No results for input(s): INR, PROTIME in the last 168 hours. Cardiac Enzymes: No results for input(s): CKTOTAL, CKMB, CKMBINDEX, TROPONINI in the last 168 hours. BNP (last 3 results) No results for input(s): PROBNP in the last 8760 hours. HbA1C: No results for input(s): HGBA1C in the last 72 hours. CBG: No results for input(s): GLUCAP in the last 168 hours. Lipid Profile: No results for input(s): CHOL, HDL, LDLCALC,  TRIG, CHOLHDL, LDLDIRECT in the last 72 hours. Thyroid Function Tests: No results for input(s): TSH, T4TOTAL, FREET4, T3FREE, THYROIDAB in the last 72 hours. Anemia Panel: No results for input(s): VITAMINB12, FOLATE, FERRITIN, TIBC, IRON, RETICCTPCT in the last 72 hours. Urine analysis:    Component Value Date/Time   COLORURINE COLORLESS (A) 12/19/2023 1403   APPEARANCEUR HAZY (A) 12/19/2023 1403   APPEARANCEUR Cloudy (A) 04/17/2015 1141   LABSPEC <1.005 (L) 12/19/2023 1403   PHURINE 6.5 12/19/2023 1403   GLUCOSEU NEGATIVE 12/19/2023 1403   HGBUR SMALL (A) 12/19/2023 1403   BILIRUBINUR NEGATIVE 12/19/2023 1403   BILIRUBINUR N 05/01/2015 1210   BILIRUBINUR Negative 04/17/2015 1141   KETONESUR NEGATIVE 12/19/2023 1403   PROTEINUR NEGATIVE 12/19/2023 1403   UROBILINOGEN 0.2 05/01/2015 1210   NITRITE NEGATIVE 12/19/2023 1403   LEUKOCYTESUR LARGE (A) 12/19/2023 1403   Sepsis Labs: @LABRCNTIP (procalcitonin:4,lacticidven:4)  ) Recent Results (from the past 240 hours)  Blood culture (routine x 2)     Status: None (Preliminary result)   Collection Time: 12/19/23  6:21 PM   Specimen: BLOOD  Result Value Ref Range Status   Specimen Description   Final    BLOOD RIGHT ANTECUBITAL Performed at [facility], [address], [place], [place] [address]    Special Requests   Final    Blood Culture adequate volume Performed at [facility], [address] [place], [place] [address]    Culture   Final    NO GROWTH < 12 HOURS Performed at [facility], [address], [place], [place] [address]    Report Status PENDING  Incomplete  Blood culture (routine x 2)     Status: None (Preliminary result)   Collection Time: 12/19/23  6:21 PM   Specimen: BLOOD  Result Value Ref Range Status   Specimen Description   Final    BLOOD LEFT ANTECUBITAL Performed at [facility], [address]  [place], [place] [address]    Special Requests   Final    BOTTLES DRAWN AEROBIC AND ANAEROBIC Blood Culture adequate volume   Culture   Final    NO GROWTH < 12 HOURS Performed at [facility], [address], [place], [place] [address]    Report Status PENDING  Incomplete         Radiology Studies: CT ABDOMEN PELVIS WO CONTRAST Result Date: 12/19/2023 CLINICAL DATA:  One month of frequent fevers, chills with urinary symptoms. EXAM: CT ABDOMEN AND PELVIS WITHOUT CONTRAST TECHNIQUE: Multidetector CT imaging of the abdomen and pelvis was performed following the standard protocol without IV contrast. RADIATION DOSE REDUCTION: This exam was performed according to the departmental dose-optimization program which includes automated exposure control, adjustment of the mA and/or kV according to [name] size and/or use of iterative reconstruction technique. COMPARISON:  April 27, 2018 FINDINGS: Lower chest: No acute abnormality. Hepatobiliary: No focal liver abnormality is seen. No gallstones, gallbladder wall thickening, or biliary dilatation. Pancreas: Unremarkable. No pancreatic ductal dilatation or surrounding inflammatory changes. Spleen: Normal in  size without focal abnormality. Adrenals/Urinary Tract: Adrenal glands are unremarkable. Kidneys are normal, without renal calculi, focal lesion, or hydronephrosis. Mild perinephric inflammatory fat stranding is seen along the lower pole of the right kidney. Bladder is unremarkable. Stomach/Bowel: Stomach is within normal limits. Appendix appears normal. No evidence of bowel wall thickening, distention, or inflammatory changes. Vascular/Lymphatic: No significant vascular findings are present. No enlarged abdominal or pelvic lymph nodes. Reproductive: Status post hysterectomy. No adnexal masses. Other: No abdominal wall hernia or abnormality. No abdominopelvic ascites. Musculoskeletal: No acute or significant osseous findings. IMPRESSION: 1. Mild perinephric  inflammatory fat stranding along the lower pole of the right kidney, which may represent sequelae associated with acute pyelonephritis. Correlation with urinalysis is recommended. 2. Evidence of prior hysterectomy. Electronically Signed   By: Suzen Dials M.D.   On: 12/19/2023 16:19        Scheduled Meds:  enoxaparin (LOVENOX) injection  40 mg Subcutaneous Q24H   Continuous Infusions:  levofloxacin (LEVAQUIN) IV       LOS: 1 day    Time spent: 35 minutes.    Leatrice Chapel, MD  [facility] Pager #: [phone] 7PM-7AM contact night coverage as above

## 2023-12-21 DIAGNOSIS — N12 Tubulo-interstitial nephritis, not specified as acute or chronic: Secondary | ICD-10-CM | POA: Diagnosis not present

## 2023-12-21 MED ORDER — CLONAZEPAM 1 MG PO TABS
1.0000 mg | ORAL_TABLET | Freq: Two times a day (BID) | ORAL | Status: DC
  Filled 2023-12-21: qty 1

## 2023-12-21 MED ORDER — LEVOFLOXACIN 750 MG PO TABS: 750.0000 mg | ORAL_TABLET | Freq: Every day | ORAL | 0 refills | Status: AC

## 2023-12-21 MED ORDER — CLONAZEPAM 0.5 MG PO TABS
0.5000 mg | ORAL_TABLET | Freq: Once | ORAL | Status: AC
  Administered 2023-12-21: 0.5 mg via ORAL
  Filled 2023-12-21: qty 1

## 2023-12-21 MED ORDER — CLONAZEPAM 1 MG PO TABS
1.0000 mg | ORAL_TABLET | Freq: Two times a day (BID) | ORAL | Status: DC
  Administered 2023-12-21: 1 mg via ORAL

## 2023-12-21 NOTE — Discharge Summary (Addendum)
 Physician Discharge Summary   Patient: [name] MRN: [id number] DOB: 1965/05/08  Admit date:     12/19/2023  Discharge date: 12/21/2023  Discharge Physician: Brigida Bureau   PCP: Patient, No Pcp Per   Recommendations at discharge:   Discharge to home Complete antibiotics Drink plenty of water Follow up with PCP in 14 days.  Discharge Diagnoses: Principal Problem:   Pyelonephritis Active Problems:   Primary parkinsonism (HCC)   Multiple sclerosis  Resolved Problems:   * No resolved hospital problems. [facility] Course: Patient is a 58 year old female past medical history significant for MS, Parkinson's disease, anxiety, depression, and PTSD.  Patient was admitted with 1 month history of dysuria, urinary frequency and urgency.  Patient was diagnosed with UTI by the PCP on 11/27/2023, treated with 7-day course of Macrobid.  Patient continued to have urinary symptoms, fevers, chills and nausea.  Right-sided back pain (actually right lower back area).  UA revealed leukocytosis.  CT abdomen pelvis showing mild perinephric inflammatory fat stranding along the lower pole of the right kidney which may represent sequelae associated with acute pyelonephritis.   On 12/21/2023 the patient is feeling much better. She does not have an elevated WBC count. She has not had a fever since presentation. She is tolerating her diet well. Her pain is controlled.   The patient is appropriate for discharge to home on oral antibiotics.  Assessment and Plan:  Principal Problem:   Pyelonephritis Active Problems:   Primary parkinsonism (HCC)   Multiple sclerosis     Acute complicated UTI/pyelonephritis -The patient states that she is feeling much better. - She denies pain. - No fevers since presentation -Leukocytosis has resolved.  - She is hemodynamically stable   Multiple sclerosis Noted - stable  Parkinson's disease Noted - stable  Anxiety and depression Noted. stable   DVT  prophylaxis: Subcutaneous Lovenox Code Status: DO NOT RESUSCITATE Family Communication:  Disposition Plan: Inpatient   I have seen and examined this patient myself. I have spent 42 minutes in her examination and discharge.  Consultants: None Procedures performed: None  Disposition: Home Diet recommendation:  Discharge Diet Orders (From admission, onward)     Start     Ordered   12/21/23 0000  Diet general        12/21/23 1121           Regular diet DISCHARGE MEDICATION: Allergies as of 12/21/2023       Reactions   Penicillins Anaphylaxis   Sulfa Antibiotics Rash, Anaphylaxis   Iodinated Contrast Media Rash        Medication List     STOP taking these medications    ciprofloxacin  500 MG tablet Commonly known as: CIPRO        TAKE these medications    clonazePAM  1 MG tablet Commonly known as: KlonoPIN  Take 1 tablet (1 mg total) by mouth 2 (two) times daily. What changed:  how much to take when to take this reasons to take this   levofloxacin 750 MG tablet Commonly known as: Levaquin Take 1 tablet (750 mg total) by mouth daily for 7 days.        Discharge Exam: Filed Weights   12/19/23 2234  Weight: 61.6 kg   Exam:  Constitutional:  The patient is awake, alert, and oriented x 3. No acute distress. Eyes:  pupils and irises appear normal Normal lids and conjunctivae ENMT:  grossly normal hearing  Lips appear normal external ears, nose appear normal Oropharynx: mucosa, tongue,posterior pharynx  appear normal Neck:  neck appears normal, no masses, normal ROM, supple no thyromegaly Respiratory:  No increased work of breathing. No wheezes, rales, or rhonchi No tactile fremitus Cardiovascular:  Regular rate and rhythm No murmurs, ectopy, or gallups. No lateral PMI. No thrills. Abdomen:  Abdomen is soft, non-tender, non-distended No hernias, masses, or organomegaly Normoactive bowel sounds.  Musculoskeletal:  No cyanosis, clubbing, or  edema Skin:  No rashes, lesions, ulcers palpation of skin: no induration or nodules Neurologic:  CN 2-12 intact Sensation all 4 extremities intact Psychiatric:  Mental status Mood, affect appropriate Orientation to person, place, time  judgment and insight appear intact   Condition at discharge: good  The results of significant diagnostics from this hospitalization (including imaging, microbiology, ancillary and laboratory) are listed below for reference.   Imaging Studies: DG Lumbar Spine 2-3 Views Result Date: 12/20/2023 CLINICAL DATA:  Back pain EXAM: LUMBAR SPINE - 2-3 VIEW COMPARISON:  None Available. FINDINGS: Frontal and lateral views of the lumbar spine are obtained on 3 images. There are 6 non-rib-bearing lumbar type vertebral bodies identified for numbering purposes. The last complete disc space will be designated as L6/S1. There are no acute displaced fractures. Disc spaces are well preserved. Mild facet hypertrophy at the lumbosacral junction. The sacroiliac joints are normal. IMPRESSION: 1. 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 2. No acute fracture. 3. Mild facet hypertrophy at the lumbosacral junction. Electronically Signed   By: Ozell Daring M.D.   On: 12/20/2023 17:02   CT ABDOMEN PELVIS WO CONTRAST Result Date: 12/19/2023 CLINICAL DATA:  One month of frequent fevers, chills with urinary symptoms. EXAM: CT ABDOMEN AND PELVIS WITHOUT CONTRAST TECHNIQUE: Multidetector CT imaging of the abdomen and pelvis was performed following the standard protocol without IV contrast. RADIATION DOSE REDUCTION: This exam was performed according to the departmental dose-optimization program which includes automated exposure control, adjustment of the mA and/or kV according to patient size and/or use of iterative reconstruction technique. COMPARISON:  April 27, 2018 FINDINGS: Lower chest: No acute abnormality. Hepatobiliary: No focal liver abnormality  is seen. No gallstones, gallbladder wall thickening, or biliary dilatation. Pancreas: Unremarkable. No pancreatic ductal dilatation or surrounding inflammatory changes. Spleen: Normal in size without focal abnormality. Adrenals/Urinary Tract: Adrenal glands are unremarkable. Kidneys are normal, without renal calculi, focal lesion, or hydronephrosis. Mild perinephric inflammatory fat stranding is seen along the lower pole of the right kidney. Bladder is unremarkable. Stomach/Bowel: Stomach is within normal limits. Appendix appears normal. No evidence of bowel wall thickening, distention, or inflammatory changes. Vascular/Lymphatic: No significant vascular findings are present. No enlarged abdominal or pelvic lymph nodes. Reproductive: Status post hysterectomy. No adnexal masses. Other: No abdominal wall hernia or abnormality. No abdominopelvic ascites. Musculoskeletal: No acute or significant osseous findings. IMPRESSION: 1. Mild perinephric inflammatory fat stranding along the lower pole of the right kidney, which may represent sequelae associated with acute pyelonephritis. Correlation with urinalysis is recommended. 2. Evidence of prior hysterectomy. Electronically Signed   By: Suzen Dials M.D.   On: 12/19/2023 16:19    Microbiology: Results for orders placed or performed during the hospital encounter of 12/19/23  Urine Culture     Status: None   Collection Time: 12/19/23  2:03 PM   Specimen: Urine, Clean Catch  Result Value Ref Range Status   Specimen Description   Final    URINE, CLEAN CATCH Performed at [facility], [address] [place], [place] [address]    Special Requests  Final    NONE Performed at [facility], [address], [place], [place] [address]    Culture   Final    NO GROWTH Performed at [facility], [address] [place], [place] [address]    Report Status 12/20/2023 FINAL  Final  Blood culture (routine x 2)      Status: None (Preliminary result)   Collection Time: 12/19/23  6:21 PM   Specimen: BLOOD  Result Value Ref Range Status   Specimen Description   Final    BLOOD RIGHT ANTECUBITAL Performed at [facility], [address] [place], [place] [address]    Special Requests   Final    Blood Culture adequate volume Performed at [facility], [address] [place], [place] [address]    Culture   Final    NO GROWTH 2 DAYS Performed at [facility], [address] [place], [place] [address]    Report Status PENDING  Incomplete  Blood culture (routine x 2)     Status: None (Preliminary result)   Collection Time: 12/19/23  6:21 PM   Specimen: BLOOD  Result Value Ref Range Status   Specimen Description   Final    BLOOD LEFT ANTECUBITAL Performed at [facility], [address], [place], [place] [address]    Special Requests   Final    BOTTLES DRAWN AEROBIC AND ANAEROBIC Blood Culture adequate volume   Culture   Final    NO GROWTH 2 DAYS Performed at [facility], [address] [place], [place] [address]    Report Status PENDING  Incomplete    Labs: CBC: Recent Labs  Lab 12/19/23 1632 12/20/23 0545  WBC 8.4 6.3  NEUTROABS 6.1  --   HGB 13.0 12.2  HCT 38.5 36.0  MCV 97.5 97.0  PLT 207 178   Basic Metabolic Panel: Recent Labs  Lab 12/19/23 1632 12/20/23 0545  NA 142 144  K 4.1 4.1  CL 102 105  CO2 27 27  GLUCOSE 106* 112*  BUN 9 13  CREATININE 0.81 0.83  CALCIUM 10.0 9.8   Liver Function Tests: Recent Labs  Lab 12/20/23 0545  AST 17  ALT 9  ALKPHOS 75  BILITOT 0.5  PROT 6.8  ALBUMIN 4.1   CBG: No results for input(s): GLUCAP in the last 168 hours.  Discharge time spent: greater than 30 minutes.  Signed: Nedda Gains, DO [facility] 12/21/2023

## 2023-12-21 NOTE — Progress Notes (Signed)
   12/21/23 1140  TOC Brief Assessment  Insurance and Status Reviewed  Patient has primary care physician Yes  Home environment has been reviewed single family home  Prior level of function: independent  Prior/Current Home Services No current home services  Social Drivers of Health Review SDOH reviewed no interventions necessary  Readmission risk has been reviewed Yes  Transition of care needs no transition of care needs at this time    Signed: Heather Saltness, MSW, LCSW Clinical Social Worker Inpatient Care Management 12/21/2023 11:40 AM

## 2023-12-21 NOTE — Progress Notes (Signed)
Patient ambulating the halls

## 2023-12-21 NOTE — Plan of Care (Signed)

## 2023-12-23 LAB — MISC LABCORP TEST (SEND OUT): Labcorp test code: 83935

## 2023-12-24 DIAGNOSIS — N12 Tubulo-interstitial nephritis, not specified as acute or chronic: Secondary | ICD-10-CM | POA: Diagnosis not present

## 2023-12-24 DIAGNOSIS — F064 Anxiety disorder due to known physiological condition: Secondary | ICD-10-CM | POA: Diagnosis not present

## 2023-12-24 DIAGNOSIS — F5104 Psychophysiologic insomnia: Secondary | ICD-10-CM | POA: Diagnosis not present

## 2023-12-24 DIAGNOSIS — F4310 Post-traumatic stress disorder, unspecified: Secondary | ICD-10-CM | POA: Diagnosis not present

## 2023-12-24 DIAGNOSIS — G20C Parkinsonism, unspecified: Secondary | ICD-10-CM | POA: Diagnosis not present

## 2023-12-24 DIAGNOSIS — G35D Multiple sclerosis, unspecified: Secondary | ICD-10-CM | POA: Diagnosis not present

## 2023-12-24 DIAGNOSIS — F329 Major depressive disorder, single episode, unspecified: Secondary | ICD-10-CM | POA: Diagnosis not present

## 2023-12-24 LAB — CULTURE, BLOOD (ROUTINE X 2)
Culture: NO GROWTH
Culture: NO GROWTH
Special Requests: ADEQUATE
Special Requests: ADEQUATE

## 2024-01-05 DIAGNOSIS — Z711 Person with feared health complaint in whom no diagnosis is made: Secondary | ICD-10-CM | POA: Diagnosis not present

## 2024-01-05 DIAGNOSIS — Z8041 Family history of malignant neoplasm of ovary: Secondary | ICD-10-CM | POA: Diagnosis not present

## 2024-01-05 DIAGNOSIS — Z1231 Encounter for screening mammogram for malignant neoplasm of breast: Secondary | ICD-10-CM | POA: Diagnosis not present

## 2024-01-05 DIAGNOSIS — Z202 Contact with and (suspected) exposure to infections with a predominantly sexual mode of transmission: Secondary | ICD-10-CM | POA: Diagnosis not present

## 2024-01-14 ENCOUNTER — Encounter (HOSPITAL_BASED_OUTPATIENT_CLINIC_OR_DEPARTMENT_OTHER): Payer: Self-pay | Admitting: Cardiology
